# Patient Record
Sex: Male | Born: 1970 | Race: Black or African American | Hispanic: No | Marital: Married | Smoking: Former smoker
Health system: Southern US, Community
[De-identification: ages and names within clinical notes are randomized; demographics above are authoritative.]

## PROBLEM LIST (undated history)

## (undated) DIAGNOSIS — F32A Depression, unspecified: Secondary | ICD-10-CM

## (undated) DIAGNOSIS — F329 Major depressive disorder, single episode, unspecified: Secondary | ICD-10-CM

## (undated) DIAGNOSIS — K219 Gastro-esophageal reflux disease without esophagitis: Secondary | ICD-10-CM

## (undated) DIAGNOSIS — E785 Hyperlipidemia, unspecified: Secondary | ICD-10-CM

## (undated) DIAGNOSIS — T7840XA Allergy, unspecified, initial encounter: Secondary | ICD-10-CM

## (undated) HISTORY — DX: Major depressive disorder, single episode, unspecified: F32.9

## (undated) HISTORY — DX: Hyperlipidemia, unspecified: E78.5

## (undated) HISTORY — PX: OTHER SURGICAL HISTORY: SHX169

## (undated) HISTORY — DX: Depression, unspecified: F32.A

## (undated) HISTORY — DX: Gastro-esophageal reflux disease without esophagitis: K21.9

## (undated) HISTORY — DX: Allergy, unspecified, initial encounter: T78.40XA

---

## 2008-11-22 ENCOUNTER — Ambulatory Visit: Payer: Self-pay | Admitting: Family Medicine

## 2008-11-22 DIAGNOSIS — F329 Major depressive disorder, single episode, unspecified: Secondary | ICD-10-CM

## 2008-11-22 DIAGNOSIS — K219 Gastro-esophageal reflux disease without esophagitis: Secondary | ICD-10-CM

## 2008-11-22 DIAGNOSIS — G4733 Obstructive sleep apnea (adult) (pediatric): Secondary | ICD-10-CM

## 2008-11-22 DIAGNOSIS — J310 Chronic rhinitis: Secondary | ICD-10-CM

## 2008-12-06 ENCOUNTER — Ambulatory Visit: Payer: Self-pay | Admitting: Family Medicine

## 2008-12-06 LAB — CONVERTED CEMR LAB
Bilirubin Urine: NEGATIVE
Specific Gravity, Urine: 1.015
Urobilinogen, UA: 0.2
pH: 5.5

## 2008-12-12 LAB — CONVERTED CEMR LAB
Alkaline Phosphatase: 92 units/L (ref 39–117)
BUN: 12 mg/dL (ref 6–23)
Basophils Absolute: 0.1 10*3/uL (ref 0.0–0.1)
Bilirubin, Direct: 0 mg/dL (ref 0.0–0.3)
CO2: 29 meq/L (ref 19–32)
Calcium: 9.2 mg/dL (ref 8.4–10.5)
Cholesterol: 222 mg/dL — ABNORMAL HIGH (ref 0–200)
Creatinine, Ser: 0.9 mg/dL (ref 0.4–1.5)
Direct LDL: 167.2 mg/dL
Eosinophils Absolute: 0.3 10*3/uL (ref 0.0–0.7)
Glucose, Bld: 255 mg/dL — ABNORMAL HIGH (ref 70–99)
Lymphocytes Relative: 25.5 % (ref 12.0–46.0)
MCHC: 33.1 g/dL (ref 30.0–36.0)
Monocytes Absolute: 0.7 10*3/uL (ref 0.1–1.0)
Neutrophils Relative %: 63 % (ref 43.0–77.0)
PSA: 0.49 ng/mL (ref 0.10–4.00)
Platelets: 248 10*3/uL (ref 150.0–400.0)
RDW: 13.5 % (ref 11.5–14.6)
Total CHOL/HDL Ratio: 5
Triglycerides: 170 mg/dL — ABNORMAL HIGH (ref 0.0–149.0)

## 2008-12-13 ENCOUNTER — Ambulatory Visit: Payer: Self-pay | Admitting: Family Medicine

## 2008-12-13 DIAGNOSIS — E1165 Type 2 diabetes mellitus with hyperglycemia: Secondary | ICD-10-CM

## 2008-12-13 DIAGNOSIS — E785 Hyperlipidemia, unspecified: Secondary | ICD-10-CM

## 2008-12-13 LAB — CONVERTED CEMR LAB: Blood Glucose, Fingerstick: 269

## 2009-01-02 ENCOUNTER — Ambulatory Visit: Payer: Self-pay | Admitting: Family Medicine

## 2009-03-15 ENCOUNTER — Ambulatory Visit: Payer: Self-pay | Admitting: Family Medicine

## 2009-03-15 LAB — CONVERTED CEMR LAB: Blood Glucose, Fingerstick: 145

## 2009-03-18 LAB — CONVERTED CEMR LAB
Hgb A1c MFr Bld: 8.5 % — ABNORMAL HIGH (ref 4.6–6.5)
Microalb, Ur: 0.9 mg/dL (ref 0.0–1.9)

## 2009-05-07 ENCOUNTER — Telehealth: Payer: Self-pay | Admitting: Family Medicine

## 2009-12-13 ENCOUNTER — Ambulatory Visit: Payer: Self-pay | Admitting: Family Medicine

## 2009-12-13 LAB — HM DIABETES FOOT EXAM

## 2009-12-13 LAB — HM DIABETES EYE EXAM: HM Diabetic Eye Exam: NORMAL

## 2009-12-18 LAB — CONVERTED CEMR LAB
LDL Cholesterol: 138 mg/dL — ABNORMAL HIGH (ref 0–99)
Total CHOL/HDL Ratio: 6
Triglycerides: 92 mg/dL (ref 0.0–149.0)
VLDL: 18.4 mg/dL (ref 0.0–40.0)

## 2010-02-04 NOTE — Progress Notes (Signed)
Summary: Metformin med refill, question  Phone Note Call from Patient   Caller: Patient Call For: Evelena Peat MD Summary of Call: Pt reports he takes Metformin 2 tabs two times a day.  His pharmacy only giving him 60 pills at a time, their last Rx is 500mg  two times a day.  This was changed to 1000mg  two times a day after lab work and pharmacy was not aware.  Will send a new Rx tp pharmacy Initial call taken by: Sid Falcon LPN,  May 07, 1608 1:14 PM  Follow-up for Phone Call        Pt informed Follow-up by: Sid Falcon LPN,  May 08, 9602 1:16 PM    Prescriptions: METFORMIN HCL 500 MG TABS (METFORMIN HCL) two by mouth two times a day  #120 x 5   Entered by:   Sid Falcon LPN   Authorized by:   Evelena Peat MD   Signed by:   Sid Falcon LPN on 54/09/8117   Method used:   Electronically to        [facility] # [id number]* (retail)       [address]       [place], [place]  [address]       Ph: [phone]       Fax: [phone]   RxID:   [id number]

## 2010-02-04 NOTE — Assessment & Plan Note (Signed)
Summary: fu on dm/njr   Vital Signs:  Patient profile:   40 year old male Weight:      215 pounds Temp:     99.1 degrees F oral BP sitting:   110 / 80  (left arm) Cuff size:   large  Vitals Entered By: Sid Falcon LPN (March 15, 2009 10:26 AM) CC: DM follow-up Is Patient Diabetic? No CBG Result 145   History of Present Illness: Followup diabetes. Recently diagnosed. On metformin 500 mg  2 po b.i.d. Symptoms of urine frequency and thirst have resolved. Exercising about 4 times per week. Fasting blood sugars run 140. No hypoglycemic symptoms.  he has made dietary changes.  Allergies (verified): No Known Drug Allergies  Past History:  Social History: Last updated: 11/22/2008 Occupation:  Case picker Married Current Smoker, one PPD Alcohol use-yes Regular exercise-no  Past Medical History: Depression GERD Testing for HIV Hay fever/Allergies Type 2 diabetes. PMH reviewed for relevance  Review of Systems  The patient denies anorexia, fever, vision loss, chest pain, syncope, dyspnea on exertion, peripheral edema, headaches, and abdominal pain.    Physical Exam  General:  Well-developed,well-nourished,in no acute distress; alert,appropriate and cooperative throughout examination Head:  Normocephalic and atraumatic without obvious abnormalities. No apparent alopecia or balding. Mouth:  Oral mucosa and oropharynx without lesions or exudates.  Teeth in good repair. Neck:  No deformities, masses, or tenderness noted. Lungs:  Normal respiratory effort, chest expands symmetrically. Lungs are clear to auscultation, no crackles or wheezes. Heart:  Normal rate and regular rhythm. S1 and S2 normal without gallop, murmur, click, rub or other extra sounds. Extremities:  No clubbing, cyanosis, edema, or deformity noted with normal full range of motion of all joints.   Psych:  normally interactive, good eye contact, not anxious appearing, and not depressed appearing.      Impression & Recommendations:  Problem # 1:  DIAB W/O MENTION COMP TYPE II/UNS TYPE UNCNTRL (ICD-250.02) Assessment Improved  check hemoglobin A1c today along with urine microalbumin screen His updated medication list for this problem includes:    Metformin Hcl 500 Mg Tabs (Metformin hcl) .Marland Kitchen..Marland Kitchen Two by mouth two times a day  Orders: Venipuncture (16109) TLB-A1C / Hgb A1C (Glycohemoglobin) (83036-A1C) TLB-Microalbumin/Creat Ratio, Urine (82043-MALB)  Complete Medication List: 1)  Fluticasone Propionate 50 Mcg/act Susp (Fluticasone propionate) .... Two sprays per nostril once daily 2)  Metformin Hcl 500 Mg Tabs (Metformin hcl) .... Two by mouth two times a day 3)  Accu-chek Aviva Strp (Glucose blood) .... Use once daily as directed 4)  Accu-chek Soft Touch Lancets Misc (Lancets) .... Use once daily as directed  Other Orders: Capillary Blood Glucose/CBG (60454)  Patient Instructions: 1)  Please schedule a follow-up appointment in 3 months .  2)  It is important that you exercise reguarly at least 20 minutes 5 times a week. If you develop chest pain, have severe difficulty breathing, or feel very tired, stop exercising immediately and seek medical attention.  3)  Check your blood sugars regularly. If your readings are usually above:  or below 70 you should contact our office.  4)  See your eye doctor yearly to check for diabetic eye damage. 5)  Check your feet each night  for sore areas, calluses or signs of infection.

## 2010-02-06 NOTE — Assessment & Plan Note (Signed)
Summary: FUP/CJR pt rsc/njr   Vital Signs:  Patient profile:   40 year old male Weight:      214 pounds O2 Sat:      97 % Temp:     98.5 degrees F Pulse rate:   97 / minute BP sitting:   130 / 86  Vitals Entered By: Pura Spice, RN (December 13, 2009 11:07 AM) CC: med ck refills states work related stress  sinus problems and been out of metformin 4 days FBS at home this am  171 Is Patient Diabetic? Yes Did you bring your meter with you today? No  Vision Screening:      Vision Comments: 12/2010   History of Present Illness: Patient seen for followup type 2 diabetes. Poor compliance with followup and diet. Also not exercising regularly. Has been taking his medication up until 4 days ago when he ran out. Last A1c 8.5%. No symptoms of hyperglycemia. No recent eye exam.  Frequent nasal congestion. He used Flonase last year but had some increased dryness. Occasional postnasal drip. No purulent nasal secretions. No fever or chills.  patient also hyperlipidemia last year but preferred lifestyle changes and reassess. LDL 167. Will likely need statin therapy.  Diabetes Management History:      He has not been enrolled in the "Diabetic Education Program".  He states that he is not following the diet appropriately.  No sensory loss is reported.  Self foot exams are being performed.  He is not checking home blood sugars.  He says that he is not exercising regularly.        Hypoglycemic symptoms are not occurring.  No hyperglycemic symptoms are reported.        No changes have been made to his treatment plan since last visit.    Allergies (verified): No Known Drug Allergies  Past History:  Past Medical History: Last updated: 03/15/2009 Depression GERD Testing for HIV Hay fever/Allergies Type 2 diabetes.  Family History: Last updated: 12/13/2008 noncontributory  Social History: Last updated: 11/22/2008 Occupation:  Case picker Married Current Smoker, one PPD Alcohol  use-yes Regular exercise-no  Risk Factors: Exercise: no (11/22/2008)  Risk Factors: Smoking Status: current (11/22/2008) Packs/Day: 1.0 (11/22/2008)  Review of Systems  The patient denies anorexia, fever, weight loss, weight gain, chest pain, syncope, dyspnea on exertion, peripheral edema, prolonged cough, headaches, hemoptysis, abdominal pain, melena, hematochezia, severe indigestion/heartburn, and vision loss.    Physical Exam  General:  Well-developed,well-nourished,in no acute distress; alert,appropriate and cooperative throughout examination Eyes:  No corneal or conjunctival inflammation noted. EOMI. Perrla. Funduscopic exam benign, without hemorrhages, exudates or papilledema. Vision grossly normal. Ears:  External ear exam shows no significant lesions or deformities.  Otoscopic examination reveals clear canals, tympanic membranes are intact bilaterally without bulging, retraction, inflammation or discharge. Hearing is grossly normal bilaterally. Mouth:  Oral mucosa and oropharynx without lesions or exudates.  Teeth in good repair. Neck:  No deformities, masses, or tenderness noted. Lungs:  Normal respiratory effort, chest expands symmetrically. Lungs are clear to auscultation, no crackles or wheezes. Heart:  Normal rate and regular rhythm. S1 and S2 normal without gallop, murmur, click, rub or other extra sounds. Extremities:  No clubbing, cyanosis, edema, or deformity noted with normal full range of motion of all joints.   Neurologic:  alert & oriented X3 and cranial nerves II-XII intact.    Diabetes Management Exam:    Foot Exam (with socks and/or shoes not present):  Sensory-Pinprick/Light touch:          Left medial foot (L-4): normal          Left dorsal foot (L-5): normal          Left lateral foot (S-1): normal          Right medial foot (L-4): normal          Right dorsal foot (L-5): normal          Right lateral foot (S-1): normal       Sensory-Monofilament:           Left foot: normal          Right foot: normal       Inspection:          Left foot: normal          Right foot: normal       Nails:          Left foot: normal          Right foot: normal    Eye Exam:       Eye Exam done here today          Results: normal   Impression & Recommendations:  Problem # 1:  DYSLIPIDEMIA (ICD-272.4)  Orders: Specimen Handling (16109) Venipuncture (60454) TLB-Lipid Panel (80061-LIPID)  Problem # 2:  DIAB W/O MENTION COMP TYPE II/UNS TYPE UNCNTRL (ICD-250.02)  His updated medication list for this problem includes:    Metformin Hcl 500 Mg Tabs (Metformin hcl) .Marland Kitchen..Marland Kitchen Two by mouth two times a day  Orders: Specimen Handling (09811) Venipuncture (91478) TLB-A1C / Hgb A1C (Glycohemoglobin) (83036-A1C)  Problem # 3:  CHRONIC RHINITIS (ICD-472.0) trial of Nasacort AQ as he had drying with Flonase.  Complete Medication List: 1)  Metformin Hcl 500 Mg Tabs (Metformin hcl) .... Two by mouth two times a day 2)  Accu-chek Aviva Strp (Glucose blood) .... Use once daily as directed 3)  Accu-chek Soft Touch Lancets Misc (Lancets) .... Use once daily as directed 4)  Nasacort Aq 55 Mcg/act Aers (Triamcinolone acetonide) .... 2 sprays per nostril once daily  Patient Instructions: 1)  Please schedule a follow-up appointment in 3 months .  2)  It is important that you exercise reguarly at least 20 minutes 5 times a week. If you develop chest pain, have severe difficulty breathing, or feel very tired, stop exercising immediately and seek medical attention.  3)  You need to lose weight. Consider a lower calorie diet and regular exercise.  4)  Check your blood sugars regularly. If your readings are usually above:  or below 70 you should contact our office.  5)  It is important that your diabetic A1c level is checked every 3 months.  6)  See your eye doctor yearly to check for diabetic eye damage. 7)  Check your feet each night  for sore areas, calluses or signs  of infection.  Prescriptions: METFORMIN HCL 500 MG TABS (METFORMIN HCL) two by mouth two times a day  #120 x 5   Entered and Authorized by:   Evelena Peat MD   Signed by:   Evelena Peat MD on 12/13/2009   Method used:   Electronically to        [facility] # [id number]* (retail)       [address]       [place], [place]  [address]       Ph: [phone]  Fax: [phone]   RxID:   [id number] NASACORT AQ 55 MCG/ACT AERS (TRIAMCINOLONE ACETONIDE) 2 sprays per nostril once daily  #1 x 11   Entered and Authorized by:   Evelena Peat MD   Signed by:   Evelena Peat MD on 12/13/2009   Method used:   Electronically to        [facility] # [id number]* (retail)       [address]       [place], [place]  [address]       Ph: [phone]       Fax: [phone]   RxID:   [id number]    Orders Added: 1)  Specimen Handling [99000] 2)  Venipuncture [87564] 3)  TLB-Lipid Panel [80061-LIPID] 4)  TLB-A1C / Hgb A1C (Glycohemoglobin) [83036-A1C] 5)  Est. Patient Level IV [33295]

## 2010-03-21 ENCOUNTER — Ambulatory Visit: Payer: Self-pay | Admitting: Family Medicine

## 2010-03-26 ENCOUNTER — Encounter: Payer: Self-pay | Admitting: Family Medicine

## 2010-03-27 ENCOUNTER — Ambulatory Visit (INDEPENDENT_AMBULATORY_CARE_PROVIDER_SITE_OTHER): Payer: 59 | Admitting: Family Medicine

## 2010-03-27 ENCOUNTER — Encounter: Payer: Self-pay | Admitting: Family Medicine

## 2010-03-27 DIAGNOSIS — E785 Hyperlipidemia, unspecified: Secondary | ICD-10-CM

## 2010-03-27 LAB — HEPATIC FUNCTION PANEL
ALT: 36 U/L (ref 0–53)
Alkaline Phosphatase: 63 U/L (ref 39–117)
Bilirubin, Direct: 0.1 mg/dL (ref 0.0–0.3)
Total Bilirubin: 0.3 mg/dL (ref 0.3–1.2)
Total Protein: 6.9 g/dL (ref 6.0–8.3)

## 2010-03-27 LAB — LIPID PANEL
Cholesterol: 135 mg/dL (ref 0–200)
VLDL: 16.4 mg/dL (ref 0.0–40.0)

## 2010-03-27 LAB — HEMOGLOBIN A1C: Hgb A1c MFr Bld: 7.2 % — ABNORMAL HIGH (ref 4.6–6.5)

## 2010-03-27 NOTE — Patient Instructions (Signed)
Diabetes Meal Planning Guide The diabetes meal planning guide is a tool to help you plan your meals and snacks. It is important for people with diabetes to manage their blood sugar levels. Choosing the right foods and the right amounts throughout your day will help control your blood sugar. Eating right can even help you improve your blood pressure and reach or maintain a healthy weight. CARBOHYDRATE COUNTING MADE EASY When you eat carbohydrates, they turn to sugar (glucose). This raises your blood sugar level. Counting carbohydrates can help you control this level so you feel better. When you plan your meals by counting carbohydrates, you can have more flexibility in what you eat and balance your medicine with your food intake. Carbohydrate counting simply means adding up the total amount of carbohydrate grams (g) in your meals or snacks. Try to eat about the same amount at each meal. Foods with carbohydrates are listed below. Each portion below is 1 carbohydrate serving or 15 grams of carbohydrates. Ask your dietician how many grams of carbohydrates you should eat at each meal or snack. Grains and Starches 1 slice bread 1/2 English muffin or hotdog/hamburger bun 3/4 cup cold cereal (unsweetened) 1/3 cup cooked pasta or rice 1/2 cup starchy vegetables (corn, potatoes, peas, beans, winter squash) 1 tortilla (6 inches) 1/4 bagel 1 waffle or pancake (size of a CD) 1/2 cup cooked cereal 4 to 6 small crackers *Whole grain is recommended Fruit 1 cup fresh unsweetened berries, melon, papaya, pineapple 1 small fresh fruit 1/2 banana or mango 1/2 cup fruit juice (4 ounces unsweetened) 1/2 cup canned fruit in natural juice or water 2 tablespoons dried fruit 12 to 15 grapes or cherries Milk and Yogurt 1 cup fat-free or 1% milk 1 cup soy milk 6 ounces light yogurt with sugar-free sweetener 6 ounces low-fat soy yogurt 6 ounces plain yogurt Vegetables 1 cup raw or 1/2 cup cooked is counted as 0  carbohydrates or a "free" food. If you eat 3 or more servings at one meal, count them as 1 carbohydrate serving. Other Carbohydrates 3/4 ounces chips or pretzels 1/2 cup ice cream or frozen yogurt 1/4 cup sherbet or sorbet 2 inch square cake, no frosting 1 tablespoon honey, sugar, jam, jelly, or syrup 2 small cookies 3 squares of graham crackers 3 cups popcorn 6 crackers 1 cup broth-based soup Count 1 cup casserole or other mixed foods as 2 carbohydrate servings. Foods with less than 20 calories in a serving may be counted as 0 carbohydrates or a "free" food. You may want to purchase a book or computer software that lists the carbohydrate gram counts of different foods. In addition, the nutrition facts panel on the labels of the foods you eat are a good source of this information. The label will tell you how big the serving size is and the total number of carbohydrate grams you will be eating per serving. Divide this number by 15 to obtain the number of carbohydrate servings in a portion. Remember: 1 carbohydrate serving equals 15 grams of carbohydrate. SERVING SIZES Measuring foods and serving sizes helps you make sure you are getting the right amount of food. The list below tells how big or small some common serving sizes are.  1 ounce (oz) of cheese.................................4 stacked dice.   2 to 3 oz cooked meat..................................Deck of cards.   1 teaspoon (tsp)............................................Tip of little finger.   1 tablespoon (tbs).........................................Thumb.   2 tbs.............................................................Golf ball.    cup...........................................................Half of a fist.   1 cup............................................................A fist.    SAMPLE DIABETES MEAL PLAN Below is a sample meal plan that includes foods from the grain and starches, dairy, vegetable, fruit, and  meat groups. A dietician can individualize a meal plan to fit your calorie needs and tell you the number of servings needed from each food group. However, controlling the total amount of carbohydrates in your meal or snack is more important than making sure you include all of the food groups at every meal. You may interchange carbohydrate containing foods (dairy, starches, and fruits). The meal plan below is an example of a 2000 calorie diet using carbohydrate counting. This meal plan has 17 carbohydrate servings (carb choices). Breakfast 1 cup oatmeal (2 carb choices) 3/4 cup light yogurt (1 carb choice) 1 cup blueberries (1 carb choice) 1/4 cup almonds  Snack 1 large apple (2 carb choices) 1 low-fat string cheese stick  Lunch Chicken breast salad:  1 cup spinach   1/4 cup chopped tomatoes   2 oz chicken breast, sliced   2 tbs low-fat Italian dressing  12 whole-wheat crackers (2 carb choices) 12 to 15 grapes (1 carb choice) 1 cup low-fat milk (1 carb choice)  Snack 1 cup carrots 1/2 cup hummus (1 carb choice)  Dinner 3 oz broiled salmon 1 cup brown rice (3 carb choices)  Snack 1 1/2 cups steamed broccoli (1 carb choice) drizzled with 1 tsp olive oil and lemon juice 1 cup light pudding (2 carb choices)  DIABETES MEAL PLANNING WORKSHEET Your dietician can use this worksheet to help you decide how many servings of foods and what types of foods are right for you.  Breakfast Food Group and Servings Carb Choices Grain/Starches _______________________________________ Dairy ______________________________________________ Vegetable _______________________________________ Fruit _______________________________________________ Meat _______________________________________________ Fat _____________________________________________ Lunch Food Group and Servings Carb Choices Grain/Starches ________________________________________ Dairy _______________________________________________ Fruit  ________________________________________________ Meat ________________________________________________ Fat _____________________________________________ Dinner Food Group and Servings Carb Choices Grain/Starches ________________________________________ Dairy _______________________________________________ Fruit ________________________________________________ Meat ________________________________________________ Fat _____________________________________________ Snacks Food Group and Servings Carb Choices Grain/Starches ________________________________________ Dairy _______________________________________________ Vegetable ________________________________________ Fruit ________________________________________________ Meat ________________________________________________ Fat _____________________________________________ Daily Totals Starches _________________________ Vegetable __________________________ Fruit ______________________________ Dairy ______________________________ Meat ______________________________ Fat ________________________________  Document Released: 09/18/2004 Document Re-Released: 06/11/2009 [employer] Patient Information 2011 [employer]. 

## 2010-03-27 NOTE — Progress Notes (Signed)
Quick Note:  Pt informed ______ 

## 2010-03-27 NOTE — Progress Notes (Signed)
  Subjective:    Patient ID: Todd English, male    DOB: 1970-01-20, 40 y.o.   MRN: [id number]  HPI  patient seen for followup type 2 diabetes and hyperlipidemia. Blood sugars running around 130 fasting. No symptoms of hyperglycemia. Occasional symptoms of possible hypoglycemia. These were mild and improved promptly with food intake. Last A1c 8.6%. Exercise somewhat inconsistent. He is making efforts at diet change   Hyperlipidemia and initiated simvastatin last visit. LDL 138. Compliant with therapy. No side effects. Denies any recent chest pains or shortness of breath.   Review of Systems  Constitutional: Negative for fever, chills, appetite change, fatigue and unexpected weight change.  Respiratory: Negative for cough and shortness of breath.   Cardiovascular: Negative for chest pain and palpitations.  Gastrointestinal: Negative for abdominal pain.  Neurological: Negative for dizziness, syncope and weakness.       Objective:   Physical Exam  Constitutional: He appears well-developed and well-nourished.  HENT:  Head: Normocephalic and atraumatic.  Eyes: Conjunctivae are normal. Pupils are equal, round, and reactive to light. Left eye exhibits discharge.  Neck: Neck supple. No thyromegaly present.  Cardiovascular: Normal rate and regular rhythm.   No murmur heard. Pulmonary/Chest: Effort normal and breath sounds normal. No respiratory distress. He has no wheezes. He has no rales.  Abdominal: Soft. Bowel sounds are normal.  Musculoskeletal: He exhibits no tenderness.  Lymphadenopathy:    He has no cervical adenopathy.  Skin: No rash noted.  Psychiatric: He has a normal mood and affect.          Assessment & Plan:  #1 type 2 diabetes. Reassess A1c. Discussed exercise and dietary factors. Routine followup 4 months  #2 dyslipidemia. Recheck lipids and hepatic panel. Goal LDL less than 100

## 2010-03-28 ENCOUNTER — Ambulatory Visit: Payer: Self-pay | Admitting: Family Medicine

## 2010-04-11 ENCOUNTER — Other Ambulatory Visit: Payer: Self-pay | Admitting: Family Medicine

## 2010-04-12 ENCOUNTER — Other Ambulatory Visit: Payer: Self-pay | Admitting: Family Medicine

## 2010-07-31 ENCOUNTER — Encounter: Payer: Self-pay | Admitting: Family Medicine

## 2010-07-31 ENCOUNTER — Telehealth: Payer: Self-pay | Admitting: Family Medicine

## 2010-07-31 ENCOUNTER — Ambulatory Visit (INDEPENDENT_AMBULATORY_CARE_PROVIDER_SITE_OTHER): Payer: 59 | Admitting: Family Medicine

## 2010-07-31 DIAGNOSIS — E119 Type 2 diabetes mellitus without complications: Secondary | ICD-10-CM

## 2010-07-31 LAB — HEMOGLOBIN A1C: Hgb A1c MFr Bld: 9.1 % — ABNORMAL HIGH (ref 4.6–6.5)

## 2010-07-31 LAB — MICROALBUMIN / CREATININE URINE RATIO
Creatinine,U: 134.2 mg/dL
Microalb Creat Ratio: 0.9 mg/g (ref 0.0–30.0)

## 2010-07-31 MED ORDER — ACCU-CHEK SOFT TOUCH LANCETS MISC: Status: DC

## 2010-07-31 MED ORDER — GLUCOSE BLOOD VI STRP: ORAL_STRIP | Status: DC

## 2010-07-31 NOTE — Progress Notes (Signed)
  Subjective:    Patient ID: Todd English, male    DOB: 10/02/1970, 40 y.o.   MRN: [id number]  HPI Patient here for diabetes followup. Hyperlipidemia well controlled. Takes metformin 1000 mg twice daily and Amaryl 2 mg daily. Not monitoring blood sugars. No symptoms of hypo-or hyperglycemia. A1c 4 months ago 7.2%. Inconsistent exercise. 6 pound weight gain. No visual changes. Compliant with diabetes medication   Review of Systems  Constitutional: Negative for fever, chills and appetite change.  Eyes: Negative for visual disturbance.  Respiratory: Negative for cough and shortness of breath.   Cardiovascular: Negative for chest pain, palpitations and leg swelling.  Gastrointestinal: Negative for abdominal pain.  Skin: Negative for rash.       Objective:   Physical Exam  Constitutional: He is oriented to person, place, and time. He appears well-developed and well-nourished. No distress.  HENT:  Mouth/Throat: Oropharynx is clear and moist.  Neck: Neck supple.  Cardiovascular: Normal rate and regular rhythm.   Pulmonary/Chest: Effort normal and breath sounds normal. No respiratory distress. He has no wheezes. He has no rales.  Musculoskeletal: He exhibits no edema.  Neurological: He is alert and oriented to person, place, and time.  Psychiatric: He has a normal mood and affect. His behavior is normal.          Assessment & Plan:  Type 2 diabetes. History of marginal control. Reassess A1c and urine microalbumin. Stressed importance of weight loss and exercise. Routine followup 4 months

## 2010-07-31 NOTE — Telephone Encounter (Signed)
Need directions for Accu Chek Aviva Plus test strips. Pharmacist wants to know if ok to put 1 test up to 4 times a day for qty of 100. Pls advise.

## 2010-07-31 NOTE — Telephone Encounter (Signed)
Pharmacist informed OK

## 2010-08-01 NOTE — Progress Notes (Signed)
Quick Note:  Pt informed ______ 

## 2010-08-03 ENCOUNTER — Other Ambulatory Visit: Payer: Self-pay | Admitting: Family Medicine

## 2010-08-20 ENCOUNTER — Other Ambulatory Visit: Payer: Self-pay | Admitting: Family Medicine

## 2010-08-21 ENCOUNTER — Telehealth: Payer: Self-pay | Admitting: Family Medicine

## 2010-08-21 MED ORDER — GLIMEPIRIDE 2 MG PO TABS: 2.0000 mg | ORAL_TABLET | Freq: Every day | ORAL | Status: DC

## 2010-08-21 NOTE — Telephone Encounter (Signed)
Pt requesting refill on glimepiride (AMARYL) 2 MG tablet   [facility]

## 2010-08-21 NOTE — Telephone Encounter (Signed)
Phone note re: swelling entered into wrong "Hayzen" chart.   Error

## 2010-08-21 NOTE — Telephone Encounter (Signed)
Pt VM was seen yesterday, got the message about the need to begin Potassium.  Pt VM reporting her swelling has inceased today. Work phone [phone] Cell [phone]

## 2010-11-13 ENCOUNTER — Ambulatory Visit (INDEPENDENT_AMBULATORY_CARE_PROVIDER_SITE_OTHER): Payer: 59 | Admitting: Family Medicine

## 2010-11-13 ENCOUNTER — Encounter: Payer: Self-pay | Admitting: Family Medicine

## 2010-11-13 VITALS — BP 100/82 | Temp 98.6°F | Wt 224.0 lb

## 2010-11-13 NOTE — Progress Notes (Signed)
  Subjective:    Patient ID: Todd English, male    DOB: Oct 04, 1970, 40 y.o.   MRN: [id number]  HPI  Followup type 2 diabetes. Remains on metformin. Recent A1c 9.1% several months ago. We recommended titration Amaryl to 4 mg but not clear if he actually did that. He is not exercising consistently. Does relate better compliance with diet. Recent fasting blood sugars below 100 range. No symptoms of hyperglycemia. Also remains on simvastatin for hyperlipidemia. No complaints at this time.   Review of Systems  Constitutional: Negative for fever, chills, appetite change and unexpected weight change.  Respiratory: Negative for shortness of breath.   Cardiovascular: Negative for chest pain.  Genitourinary: Negative for frequency.       Objective:   Physical Exam  Constitutional: He appears well-developed and well-nourished.  Neck: Neck supple. No thyromegaly present.  Cardiovascular: Normal rate and regular rhythm.   Pulmonary/Chest: Effort normal and breath sounds normal. No respiratory distress. He has no wheezes. He has no rales.  Musculoskeletal: He exhibits no edema.  Lymphadenopathy:    He has no cervical adenopathy.          Assessment & Plan:  Type 2 diabetes. History of poor control. Reassess A1c. Establish more regular exercise. Routine followup 4 months and plan lipids and hepatic panel then

## 2010-11-14 ENCOUNTER — Encounter: Payer: Self-pay | Admitting: Family Medicine

## 2010-11-14 NOTE — Progress Notes (Signed)
Quick Note:  Pt informed, will increase amaryl to 2 tabs daily ______

## 2010-11-20 ENCOUNTER — Ambulatory Visit: Payer: 59 | Admitting: Family Medicine

## 2011-03-12 ENCOUNTER — Ambulatory Visit (INDEPENDENT_AMBULATORY_CARE_PROVIDER_SITE_OTHER): Payer: 59 | Admitting: Family Medicine

## 2011-03-12 ENCOUNTER — Encounter: Payer: Self-pay | Admitting: Family Medicine

## 2011-03-12 VITALS — BP 104/64 | Temp 98.1°F | Wt 222.0 lb

## 2011-03-12 DIAGNOSIS — E785 Hyperlipidemia, unspecified: Secondary | ICD-10-CM

## 2011-03-12 LAB — LIPID PANEL
LDL Cholesterol: 109 mg/dL — ABNORMAL HIGH (ref 0–99)
Total CHOL/HDL Ratio: 3
Triglycerides: 54 mg/dL (ref 0.0–149.0)

## 2011-03-12 LAB — HEPATIC FUNCTION PANEL
AST: 17 U/L (ref 0–37)
Albumin: 4.2 g/dL (ref 3.5–5.2)
Alkaline Phosphatase: 60 U/L (ref 39–117)
Total Bilirubin: 0.1 mg/dL — ABNORMAL LOW (ref 0.3–1.2)

## 2011-03-12 MED ORDER — GLIMEPIRIDE 4 MG PO TABS: 4.0000 mg | ORAL_TABLET | Freq: Every day | ORAL | Status: DC

## 2011-03-12 NOTE — Progress Notes (Signed)
  Subjective:    Patient ID: Todd English, male    DOB: 30-Jul-1970, 41 y.o.   MRN: [id number]  HPI  Medical followup. Type 2 diabetes. Most recent A1c 7.9%. Rarely checks blood sugars but usually around 120-150 fasting. No symptoms of hyperglycemia. Last visit titrated Amaryl to 4 mg. Remains on metformin 1000 mg twice daily. Takes simvastatin somewhat inconsistently. No side effects. Needs repeat lipids. No history of peripheral vascular disease or CAD. Nonsmoker. Inconsistent exercise. No recent visual symptoms. No neuropathy symptoms.  Past Medical History  Diagnosis Date  . Depression   . GERD (gastroesophageal reflux disease)   . Allergy   . Diabetes mellitus    No past surgical history on file.  reports that he quit smoking about 14 months ago. His smoking use included Cigarettes. He has a 15 pack-year smoking history. He does not have any smokeless tobacco history on file. He reports that he drinks alcohol. His drug history not on file. family history is not on file. No Known Allergies    Review of Systems  Constitutional: Negative for fatigue.  Eyes: Negative for visual disturbance.  Respiratory: Negative for cough, chest tightness and shortness of breath.   Cardiovascular: Negative for chest pain, palpitations and leg swelling.  Neurological: Negative for dizziness, syncope, weakness, light-headedness and headaches.       Objective:   Physical Exam  Constitutional: He appears well-developed and well-nourished.  HENT:  Mouth/Throat: Oropharynx is clear and moist.  Neck: Neck supple. No thyromegaly present.  Cardiovascular: Normal rate and regular rhythm.   Pulmonary/Chest: Effort normal and breath sounds normal. No respiratory distress. He has no wheezes. He has no rales.  Musculoskeletal: He exhibits no edema.          Assessment & Plan:  #1 type 2 diabetes. History of suboptimal control. Recheck A1c. Emphasized importance of regular consistent exercise.  Reminder for eye exam #2 dyslipidemia. Check lipids and hepatic panel. Increase consistency of simvastatin

## 2011-03-17 NOTE — Progress Notes (Signed)
Quick Note:  Pt informed on cell phone VM ______

## 2011-05-06 ENCOUNTER — Other Ambulatory Visit: Payer: Self-pay | Admitting: Family Medicine

## 2011-06-23 ENCOUNTER — Other Ambulatory Visit: Payer: Self-pay | Admitting: Family Medicine

## 2012-02-06 ENCOUNTER — Other Ambulatory Visit: Payer: Self-pay | Admitting: Family Medicine

## 2012-03-27 ENCOUNTER — Other Ambulatory Visit: Payer: Self-pay | Admitting: Family Medicine

## 2012-07-06 ENCOUNTER — Other Ambulatory Visit: Payer: Self-pay | Admitting: Family Medicine

## 2012-08-01 ENCOUNTER — Other Ambulatory Visit: Payer: Self-pay | Admitting: Family Medicine

## 2012-08-15 ENCOUNTER — Other Ambulatory Visit: Payer: Self-pay | Admitting: Family Medicine

## 2012-11-10 ENCOUNTER — Ambulatory Visit: Payer: 59 | Admitting: Family Medicine

## 2012-11-18 ENCOUNTER — Encounter: Payer: Self-pay | Admitting: Family Medicine

## 2012-11-18 ENCOUNTER — Ambulatory Visit (INDEPENDENT_AMBULATORY_CARE_PROVIDER_SITE_OTHER): Payer: 59 | Admitting: Family Medicine

## 2012-11-18 VITALS — BP 130/78 | HR 108 | Temp 98.0°F | Wt 220.0 lb

## 2012-11-18 DIAGNOSIS — E669 Obesity, unspecified: Secondary | ICD-10-CM | POA: Insufficient documentation

## 2012-11-18 DIAGNOSIS — IMO0001 Reserved for inherently not codable concepts without codable children: Secondary | ICD-10-CM

## 2012-11-18 DIAGNOSIS — E785 Hyperlipidemia, unspecified: Secondary | ICD-10-CM

## 2012-11-18 LAB — BASIC METABOLIC PANEL
BUN: 12 mg/dL (ref 6–23)
CO2: 29 mEq/L (ref 19–32)
Chloride: 99 mEq/L (ref 96–112)
Creatinine, Ser: 1.3 mg/dL (ref 0.4–1.5)

## 2012-11-18 LAB — HEPATIC FUNCTION PANEL
Albumin: 4.1 g/dL (ref 3.5–5.2)
Alkaline Phosphatase: 85 U/L (ref 39–117)
Bilirubin, Direct: 0 mg/dL (ref 0.0–0.3)

## 2012-11-18 LAB — MICROALBUMIN / CREATININE URINE RATIO
Creatinine,U: 82.2 mg/dL
Microalb Creat Ratio: 1.7 mg/g (ref 0.0–30.0)
Microalb, Ur: 1.4 mg/dL (ref 0.0–1.9)

## 2012-11-18 LAB — LIPID PANEL: Total CHOL/HDL Ratio: 5

## 2012-11-18 LAB — HM DIABETES FOOT EXAM

## 2012-11-18 MED ORDER — GLIMEPIRIDE 4 MG PO TABS: 4.0000 mg | ORAL_TABLET | Freq: Every day | ORAL | Status: DC

## 2012-11-18 MED ORDER — METFORMIN HCL 500 MG PO TABS: 500.0000 mg | ORAL_TABLET | Freq: Two times a day (BID) | ORAL | Status: DC

## 2012-11-18 MED ORDER — SIMVASTATIN 20 MG PO TABS: 20.0000 mg | ORAL_TABLET | Freq: Every day | ORAL | Status: DC

## 2012-11-18 NOTE — Progress Notes (Signed)
  Subjective:    Patient ID: Todd English, male    DOB: Jun 16, 1970, 42 y.o.   MRN: [id number]  HPI Patient here for medical followup. History of poor compliance we have not seen him for over one year. He states he has been working about 80 hours per week and has not been exercising. Dietary compliance is fair He takes metformin, glimepiride, and simvastatin. Denies any symptoms of hyperglycemia. No recent eye exams. He denies any symptoms of neuropathy and no recent visual changes.  He's never had any hypertension issues. He has dry feet and does not use any moisturizers regularly. Patient is a nonsmoker.  Past Medical History  Diagnosis Date  . Depression   . GERD (gastroesophageal reflux disease)   . Allergy   . Diabetes mellitus    No past surgical history on file.  reports that he quit smoking about 2 years ago. His smoking use included Cigarettes. He has a 15 pack-year smoking history. He does not have any smokeless tobacco history on file. He reports that he drinks alcohol. His drug history is not on file. family history is not on file. No Known Allergies    Review of Systems  Constitutional: Negative for fatigue and unexpected weight change.  Eyes: Negative for visual disturbance.  Respiratory: Negative for cough, chest tightness and shortness of breath.   Cardiovascular: Negative for chest pain, palpitations and leg swelling.  Endocrine: Negative for polydipsia and polyuria.  Neurological: Negative for dizziness, syncope, weakness, light-headedness and headaches.       Objective:   Physical Exam  Constitutional: He appears well-developed and well-nourished.  Cardiovascular: Normal rate and regular rhythm.   Pulmonary/Chest: Effort normal and breath sounds normal. No respiratory distress. He has no wheezes. He has no rales.  Musculoskeletal: He exhibits no edema.  Skin:  Feet are very dry. He has no calluses. No ulcerations. Normal sensory function           Assessment & Plan:  Type 2 diabetes. History of poor control. History of poor compliance. Recheck A1c. Check urine microalbumin. Establish more consistent followup. Refilled his usual medications and will likely need to add some additional changes depending on labs. Set up eye exam  Hyperlipidemia. Recheck lipid and hepatic panel. Refill simvastatin

## 2012-11-18 NOTE — Progress Notes (Signed)
Pre visit review using our clinic review tool, if applicable. No additional management support is needed unless otherwise documented below in the visit note. 

## 2012-11-21 LAB — LDL CHOLESTEROL, DIRECT: Direct LDL: 137.9 mg/dL

## 2012-11-25 ENCOUNTER — Telehealth: Payer: Self-pay | Admitting: Family Medicine

## 2012-11-25 ENCOUNTER — Other Ambulatory Visit: Payer: Self-pay

## 2012-11-25 MED ORDER — SIMVASTATIN 40 MG PO TABS: 40.0000 mg | ORAL_TABLET | Freq: Every day | ORAL | Status: DC

## 2012-11-25 NOTE — Telephone Encounter (Signed)
Pt informed

## 2012-11-25 NOTE — Telephone Encounter (Addendum)
Pt returning your call. Now is a good time to get back in touch w/ pt!!

## 2013-02-22 ENCOUNTER — Ambulatory Visit: Payer: 59 | Admitting: Family Medicine

## 2013-02-23 ENCOUNTER — Ambulatory Visit (INDEPENDENT_AMBULATORY_CARE_PROVIDER_SITE_OTHER): Payer: 59 | Admitting: Family Medicine

## 2013-02-23 ENCOUNTER — Encounter: Payer: Self-pay | Admitting: Family Medicine

## 2013-02-23 ENCOUNTER — Other Ambulatory Visit: Payer: Self-pay

## 2013-02-23 VITALS — BP 130/80 | HR 100 | Wt 225.0 lb

## 2013-02-23 DIAGNOSIS — E1165 Type 2 diabetes mellitus with hyperglycemia: Principal | ICD-10-CM

## 2013-02-23 DIAGNOSIS — E785 Hyperlipidemia, unspecified: Secondary | ICD-10-CM

## 2013-02-23 DIAGNOSIS — IMO0001 Reserved for inherently not codable concepts without codable children: Secondary | ICD-10-CM

## 2013-02-23 LAB — HEPATIC FUNCTION PANEL
ALT: 18 U/L (ref 0–53)
AST: 17 U/L (ref 0–37)
Albumin: 3.9 g/dL (ref 3.5–5.2)
Alkaline Phosphatase: 65 U/L (ref 39–117)
Bilirubin, Direct: 0 mg/dL (ref 0.0–0.3)
Total Bilirubin: 0.3 mg/dL (ref 0.3–1.2)
Total Protein: 7.3 g/dL (ref 6.0–8.3)

## 2013-02-23 LAB — HEMOGLOBIN A1C: Hgb A1c MFr Bld: 11.2 % — ABNORMAL HIGH (ref 4.6–6.5)

## 2013-02-23 LAB — LDL CHOLESTEROL, DIRECT: Direct LDL: 82.3 mg/dL

## 2013-02-23 LAB — HM DIABETES FOOT EXAM

## 2013-02-23 LAB — LIPID PANEL
Cholesterol: 141 mg/dL (ref 0–200)
HDL: 39.8 mg/dL (ref >39.00)
TRIGLYCERIDES: 201 mg/dL — AB (ref 0.0–149.0)
Total CHOL/HDL Ratio: 4
VLDL: 40.2 mg/dL — ABNORMAL HIGH (ref 0.0–40.0)

## 2013-02-23 LAB — HM DIABETES EYE EXAM

## 2013-02-23 MED ORDER — CANAGLIFLOZIN 300 MG PO TABS: 1.0000 | ORAL_TABLET | Freq: Every day | ORAL | Status: DC

## 2013-02-23 MED ORDER — METFORMIN HCL 1000 MG PO TABS: 1000.0000 mg | ORAL_TABLET | Freq: Two times a day (BID) | ORAL | Status: DC

## 2013-02-23 MED ORDER — SIMVASTATIN 40 MG PO TABS: 40.0000 mg | ORAL_TABLET | Freq: Every day | ORAL | Status: DC

## 2013-02-23 NOTE — Patient Instructions (Signed)
Start baby aspirin 81 mg once daily Increase metformin to 1,000 mg twice daily Start Invokana 300 mg once daily.

## 2013-02-23 NOTE — Progress Notes (Signed)
   Subjective:    Patient ID: Todd English, male    DOB: 1970/11/15, 43 y.o.   MRN: [id number]  HPI Medical followup. Patient is type 2 diabetes, hyperlipidemia, obesity. History of poor compliance. He is currently working 6 days per week about 12 hours per day. No consistent exercise. Not checking blood sugars.  Recent hemoglobin A1c 12.6%. He is taking metformin 500 mg twice daily consistently and Amaryl 4 mg daily. We recommended 2-3 week followup he is just now getting back. He does not have any symptoms of hyperglycemia. Recent LDL 137. We increased his simvastatin to 40 mg daily. Tolerating without side effects. No aspirin use. No chest pains. Poor dietary compliance at times.  Past Medical History  Diagnosis Date  . Depression   . GERD (gastroesophageal reflux disease)   . Allergy   . Diabetes mellitus    No past surgical history on file.  reports that he quit smoking about 3 years ago. His smoking use included Cigarettes. He has a 15 pack-year smoking history. He does not have any smokeless tobacco history on file. He reports that he drinks alcohol. His drug history is not on file. family history is not on file. No Known Allergies    Review of Systems  Constitutional: Negative for fatigue.  Eyes: Negative for visual disturbance.  Respiratory: Negative for cough, chest tightness and shortness of breath.   Cardiovascular: Negative for chest pain, palpitations and leg swelling.  Gastrointestinal: Negative for abdominal pain.  Endocrine: Negative for polydipsia and polyuria.  Neurological: Negative for dizziness, syncope, weakness, light-headedness and headaches.       Objective:   Physical Exam  Constitutional: He is oriented to person, place, and time. He appears well-developed and well-nourished.  HENT:  Right Ear: External ear normal.  Left Ear: External ear normal.  Mouth/Throat: Oropharynx is clear and moist.  Eyes: Pupils are equal, round, and reactive to  light.  Neck: Neck supple. No thyromegaly present.  Cardiovascular: Normal rate and regular rhythm.   Pulmonary/Chest: Effort normal and breath sounds normal. No respiratory distress. He has no wheezes. He has no rales.  Musculoskeletal: He exhibits no edema.  Neurological: He is alert and oriented to person, place, and time.  Skin:  Feet are dry but no skin lesions.          Assessment & Plan:  #1 type 2 diabetes. Poor control. History of poor compliance. Recheck A1c. Titrate metformin 1000 mg twice daily. Continue Amaryl. Add Invokana 300 mg once daily. Samples provided.  Patient is strongly encouraged to set up eye exam #2 dyslipidemia. Recent increase in simvastatin. Recheck lipids today

## 2013-02-23 NOTE — Progress Notes (Signed)
Pre visit review using our clinic review tool, if applicable. No additional management support is needed unless otherwise documented below in the visit note. 

## 2013-02-24 ENCOUNTER — Telehealth: Payer: Self-pay

## 2013-02-24 NOTE — Telephone Encounter (Signed)
Emmi education was mailed to patient

## 2013-05-25 ENCOUNTER — Encounter: Payer: Self-pay | Admitting: Family Medicine

## 2013-05-25 ENCOUNTER — Ambulatory Visit (INDEPENDENT_AMBULATORY_CARE_PROVIDER_SITE_OTHER): Payer: 59 | Admitting: Family Medicine

## 2013-05-25 VITALS — BP 130/70 | HR 87 | Wt 223.0 lb

## 2013-05-25 DIAGNOSIS — E785 Hyperlipidemia, unspecified: Secondary | ICD-10-CM

## 2013-05-25 DIAGNOSIS — IMO0001 Reserved for inherently not codable concepts without codable children: Secondary | ICD-10-CM

## 2013-05-25 DIAGNOSIS — E1165 Type 2 diabetes mellitus with hyperglycemia: Principal | ICD-10-CM

## 2013-05-25 NOTE — Progress Notes (Signed)
Pre visit review using our clinic review tool, if applicable. No additional management support is needed unless otherwise documented below in the visit note. 

## 2013-05-25 NOTE — Patient Instructions (Signed)
Start Farxiga 5 mg once daily

## 2013-05-25 NOTE — Progress Notes (Signed)
   Subjective:    Patient ID: Todd English, male    DOB: August 17, 1970, 43 y.o.   MRN: [id number]  HPI Followup poorly controlled type 2 diabetes. Has been working extremely long hours, sometimes up to 90 hours per week and not exercising any. His weight is stable. We had added Invokana last visit and he states he had night sweats which he attributed to this medication and stopped this after just a few doses. He does remain on metformin and Amaryl. Not monitoring blood sugars. No symptoms of polyuria or polydipsia. A1c is very poorly controlled with last A1c over 10%. he's not had any neuropathy symptoms.  He has hyperlipidemia treated with simvastatin. No recent chest pains. No dizziness. No headaches. His blood pressures have remained stable. Urine microalbumin screen negative last fall  Past Medical History  Diagnosis Date  . Depression   . GERD (gastroesophageal reflux disease)   . Allergy   . Diabetes mellitus    No past surgical history on file.  reports that he quit smoking about 3 years ago. His smoking use included Cigarettes. He has a 15 pack-year smoking history. He does not have any smokeless tobacco history on file. He reports that he drinks alcohol. His drug history is not on file. family history is not on file. No Known Allergies    Review of Systems  Constitutional: Negative for fatigue.  Eyes: Negative for visual disturbance.  Respiratory: Negative for cough, chest tightness and shortness of breath.   Cardiovascular: Negative for chest pain, palpitations and leg swelling.  Neurological: Negative for dizziness, syncope, weakness, light-headedness and headaches.       Objective:   Physical Exam  Constitutional: He appears well-developed and well-nourished.  HENT:  Mouth/Throat: Oropharynx is clear and moist.  Cardiovascular: Normal rate and regular rhythm.   Pulmonary/Chest: Effort normal and breath sounds normal. No respiratory distress. He has no wheezes. He  has no rales.  Musculoskeletal: He exhibits no edema.  Skin:  Feet reveal no skin lesions. Good distal foot pulses. Good capillary refill. No calluses. Normal sensation with monofilament testing           Assessment & Plan:  #1 type 2 diabetes. Poorly controlled. Repeat A1c. Suspect this will still be elevated secondary to poor compliance as above. We did provide samples of  farxiga 5 mg once daily. If he does not tolerate this we discussed other options including insulin or possibly GLP-1 inhibitor, though he is somewhat reluctant regarding injectables #2 hyperlipidemia. Well controlled at last visit

## 2013-05-26 LAB — HEMOGLOBIN A1C: Hgb A1c MFr Bld: 8.8 % — ABNORMAL HIGH (ref 4.6–6.5)

## 2013-05-30 ENCOUNTER — Other Ambulatory Visit: Payer: Self-pay | Admitting: Family Medicine

## 2013-05-30 DIAGNOSIS — IMO0001 Reserved for inherently not codable concepts without codable children: Secondary | ICD-10-CM

## 2013-05-30 DIAGNOSIS — E1165 Type 2 diabetes mellitus with hyperglycemia: Principal | ICD-10-CM

## 2013-08-25 ENCOUNTER — Ambulatory Visit: Payer: 59 | Admitting: Family Medicine

## 2013-09-01 ENCOUNTER — Ambulatory Visit: Payer: 59 | Admitting: Family Medicine

## 2013-09-28 ENCOUNTER — Ambulatory Visit (INDEPENDENT_AMBULATORY_CARE_PROVIDER_SITE_OTHER): Payer: 59 | Admitting: Family Medicine

## 2013-09-28 ENCOUNTER — Encounter: Payer: Self-pay | Admitting: Family Medicine

## 2013-09-28 VITALS — BP 130/80 | HR 90 | Wt 227.0 lb

## 2013-09-28 DIAGNOSIS — IMO0001 Reserved for inherently not codable concepts without codable children: Secondary | ICD-10-CM

## 2013-09-28 DIAGNOSIS — E785 Hyperlipidemia, unspecified: Secondary | ICD-10-CM

## 2013-09-28 DIAGNOSIS — E1165 Type 2 diabetes mellitus with hyperglycemia: Principal | ICD-10-CM

## 2013-09-28 LAB — MICROALBUMIN / CREATININE URINE RATIO
CREATININE, U: 218.7 mg/dL
MICROALB UR: 4.2 mg/dL — AB (ref 0.0–1.9)
Microalb Creat Ratio: 1.9 mg/g (ref 0.0–30.0)

## 2013-09-28 LAB — HM DIABETES FOOT EXAM

## 2013-09-28 LAB — HM DIABETES EYE EXAM

## 2013-09-28 LAB — HEMOGLOBIN A1C: Hgb A1c MFr Bld: 9.4 % — ABNORMAL HIGH (ref 4.6–6.5)

## 2013-09-28 NOTE — Progress Notes (Signed)
   Subjective:    Patient ID: Todd English, male    DOB: Feb 07, 1970, 43 y.o.   MRN: [id number]  HPI Patient seen for medical followup. He has poorly controlled type 2 diabetes. We had provided samples of Farxiga last visit but he apparently became concerned about possible side effects and never started this. He remains on metformin and Amaryl. His last A1c was 8.8%. Not monitoring blood sugars. He has not had any symptoms of polyuria or polydipsia. He has not had eye exam during the past year. He remains on simvastatin for hyperlipidemia. Compliant with taking anti-lipid medication.  He states he is getting ready to be laid off from his current job probably in October. He is concerned about cost issues with medication because of that. He currently has very limited exercise as most of his work days are 12 hours. No recent chest pains. No dizziness.  Past Medical History  Diagnosis Date  . Depression   . GERD (gastroesophageal reflux disease)   . Allergy   . Diabetes mellitus    No past surgical history on file.  reports that he quit smoking about 3 years ago. His smoking use included Cigarettes. He has a 15 pack-year smoking history. He does not have any smokeless tobacco history on file. He reports that he drinks alcohol. His drug history is not on file. family history is not on file. No Known Allergies    Review of Systems  Constitutional: Negative for fatigue.  Eyes: Negative for visual disturbance.  Respiratory: Negative for cough, chest tightness and shortness of breath.   Cardiovascular: Negative for chest pain, palpitations and leg swelling.  Endocrine: Negative for polydipsia and polyuria.  Neurological: Negative for dizziness, syncope, weakness, light-headedness and headaches.       Objective:   Physical Exam  Constitutional: He is oriented to person, place, and time. He appears well-developed and well-nourished.  HENT:  Right Ear: External ear normal.  Left Ear:  External ear normal.  Mouth/Throat: Oropharynx is clear and moist.  Eyes: Pupils are equal, round, and reactive to light.  Neck: Neck supple. No thyromegaly present.  Cardiovascular: Normal rate and regular rhythm.   Pulmonary/Chest: Effort normal and breath sounds normal. No respiratory distress. He has no wheezes. He has no rales.  Musculoskeletal: He exhibits no edema.  Neurological: He is alert and oriented to person, place, and time.          Assessment & Plan:  #1 type 2 diabetes. History of poor control. History of poor compliance. Patient is concerned regarding cost issues with impending being laid off. Recheck A1c today. We discussed the importance of establishing more consistent exercise and dietary compliance as a first step. We may have to look at other options such as DPP4 inhibitor although cost may be an issue. #2 dyslipidemia. Continue simvastatin 40 mg daily #3 health maintenance. Flu vaccine recommended and patient declines

## 2013-09-28 NOTE — Progress Notes (Signed)
Pre visit review using our clinic review tool, if applicable. No additional management support is needed unless otherwise documented below in the visit note. 

## 2013-12-03 ENCOUNTER — Other Ambulatory Visit: Payer: Self-pay | Admitting: Family Medicine

## 2014-03-13 ENCOUNTER — Other Ambulatory Visit: Payer: Self-pay | Admitting: Family Medicine

## 2014-05-07 ENCOUNTER — Other Ambulatory Visit: Payer: Self-pay | Admitting: Family Medicine

## 2014-06-12 LAB — HM DIABETES EYE EXAM

## 2014-06-13 ENCOUNTER — Ambulatory Visit (INDEPENDENT_AMBULATORY_CARE_PROVIDER_SITE_OTHER): Payer: Managed Care, Other (non HMO) | Admitting: Family Medicine

## 2014-06-13 ENCOUNTER — Encounter: Payer: Self-pay | Admitting: Family Medicine

## 2014-06-13 VITALS — BP 126/80 | HR 91 | Temp 98.3°F | Wt 222.0 lb

## 2014-06-13 DIAGNOSIS — E538 Deficiency of other specified B group vitamins: Secondary | ICD-10-CM

## 2014-06-13 DIAGNOSIS — IMO0002 Reserved for concepts with insufficient information to code with codable children: Secondary | ICD-10-CM

## 2014-06-13 DIAGNOSIS — R202 Paresthesia of skin: Secondary | ICD-10-CM

## 2014-06-13 DIAGNOSIS — E1165 Type 2 diabetes mellitus with hyperglycemia: Secondary | ICD-10-CM

## 2014-06-13 MED ORDER — GLIMEPIRIDE 4 MG PO TABS: ORAL_TABLET | ORAL | Status: DC

## 2014-06-13 MED ORDER — GLUCOSE BLOOD VI STRP: ORAL_STRIP | Status: DC

## 2014-06-13 MED ORDER — SIMVASTATIN 40 MG PO TABS: 40.0000 mg | ORAL_TABLET | Freq: Every day | ORAL | Status: DC

## 2014-06-13 MED ORDER — METFORMIN HCL 1000 MG PO TABS: 1000.0000 mg | ORAL_TABLET | Freq: Two times a day (BID) | ORAL | Status: DC

## 2014-06-13 MED ORDER — ACCU-CHEK SOFT TOUCH LANCETS MISC: Status: DC

## 2014-06-13 NOTE — Progress Notes (Signed)
Pre visit review using our clinic review tool, if applicable. No additional management support is needed unless otherwise documented below in the visit note. 

## 2014-06-13 NOTE — Progress Notes (Signed)
   Subjective:    Patient ID: Todd English, male    DOB: 04-03-1970, 44 y.o.   MRN: [id number]  HPI Patient seen with tingling sensation and mild numbness involving his fingertips of basically all digits of both hands- right hand greater than left. He is working about 70 hours per week and does a lot of repetitive things with his hands. Denies any wrist pains. No hand pain. No weakness. No cervical neck pains. Denies any feet symptoms. No clear exacerbating factors. No alleviating factors. Symptoms tend to last throughout much of the day.  Does have type 2 diabetes with history of poor control and poor compliance. Last A1c 9.4%. He does have better insurance coverage now. Remains on Amaryl and metformin.  Past Medical History  Diagnosis Date  . Depression   . GERD (gastroesophageal reflux disease)   . Allergy   . Diabetes mellitus    No past surgical history on file.  reports that he quit smoking about 4 years ago. His smoking use included Cigarettes. He has a 15 pack-year smoking history. He does not have any smokeless tobacco history on file. He reports that he drinks alcohol. His drug history is not on file. family history is not on file. No Known Allergies    Review of Systems  Constitutional: Negative for fatigue.  Eyes: Negative for visual disturbance.  Respiratory: Negative for cough, chest tightness and shortness of breath.   Cardiovascular: Negative for chest pain, palpitations and leg swelling.  Neurological: Positive for numbness. Negative for dizziness, syncope, weakness, light-headedness and headaches.       Objective:   Physical Exam  Constitutional: He is oriented to person, place, and time. He appears well-developed and well-nourished.  HENT:  Right Ear: External ear normal.  Left Ear: External ear normal.  Mouth/Throat: Oropharynx is clear and moist.  Eyes: Pupils are equal, round, and reactive to light.  Neck: Neck supple. No thyromegaly present.    Cardiovascular: Normal rate and regular rhythm.   Pulmonary/Chest: Effort normal and breath sounds normal. No respiratory distress. He has no wheezes. He has no rales.  Musculoskeletal: He exhibits no edema.  Neurological: He is alert and oriented to person, place, and time. He has normal reflexes.  No focal strength deficits. Normal sensory function to touch          Assessment & Plan:  Paresthesias involving both hands. Clinically, does not fit with carpal tunnel syndrome. He does have poorly controlled diabetes which could be a risk factor though he denies any lower extremity symptoms. Check B12, A1c, TSH. Tighten up diabetes control.

## 2014-06-14 LAB — VITAMIN D 25 HYDROXY (VIT D DEFICIENCY, FRACTURES): VITD: 11.24 ng/mL — AB (ref 30.00–100.00)

## 2014-06-14 LAB — TSH: TSH: 1.28 u[IU]/mL (ref 0.35–4.50)

## 2014-06-14 LAB — HEMOGLOBIN A1C: HEMOGLOBIN A1C: 9.6 % — AB (ref 4.6–6.5)

## 2014-06-15 ENCOUNTER — Other Ambulatory Visit: Payer: Self-pay | Admitting: *Deleted

## 2014-06-15 ENCOUNTER — Other Ambulatory Visit: Payer: Managed Care, Other (non HMO)

## 2014-06-15 DIAGNOSIS — R202 Paresthesia of skin: Secondary | ICD-10-CM

## 2014-06-15 MED ORDER — DULAGLUTIDE 0.75 MG/0.5ML ~~LOC~~ SOAJ: 1.0000 "pen " | SUBCUTANEOUS | Status: DC

## 2014-06-15 MED ORDER — VITAMIN D (ERGOCALCIFEROL) 1.25 MG (50000 UNIT) PO CAPS: 50000.0000 [IU] | ORAL_CAPSULE | ORAL | Status: DC

## 2014-06-15 NOTE — Addendum Note (Signed)
Addended by: Elmer Picker on: 06/15/2014 03:56 PM   Modules accepted: Orders

## 2014-06-15 NOTE — Addendum Note (Signed)
Addended by: Agnes Lawrence on: 06/15/2014 12:08 PM   Modules accepted: Orders

## 2014-06-16 LAB — VITAMIN B12: Vitamin B-12: 522 pg/mL (ref 211–911)

## 2014-06-21 ENCOUNTER — Telehealth: Payer: Self-pay | Admitting: Family Medicine

## 2014-06-21 MED ORDER — GLUCOSE BLOOD VI STRP: ORAL_STRIP | Status: DC

## 2014-06-21 MED ORDER — ACCU-CHEK SOFT TOUCH LANCETS MISC: Status: DC

## 2014-06-21 NOTE — Telephone Encounter (Signed)
Rx sent to pharmacy   

## 2014-06-21 NOTE — Telephone Encounter (Signed)
Pt called in req test strips

## 2014-06-27 ENCOUNTER — Other Ambulatory Visit: Payer: Self-pay | Admitting: *Deleted

## 2014-06-27 MED ORDER — GLUCOSE BLOOD VI STRP: ORAL_STRIP | Status: DC

## 2014-06-27 NOTE — Telephone Encounter (Signed)
[facility] informs [place] that Accu-chek is no longer covered under patient insurance.  Left message on machine for patient that One Touch is covered, glucometer up front, and rx sent for test strips.

## 2014-07-18 ENCOUNTER — Encounter: Payer: Self-pay | Admitting: Family Medicine

## 2014-09-05 ENCOUNTER — Other Ambulatory Visit: Payer: Self-pay | Admitting: Family Medicine

## 2014-09-09 ENCOUNTER — Other Ambulatory Visit: Payer: Self-pay | Admitting: Family Medicine

## 2014-09-13 ENCOUNTER — Ambulatory Visit (INDEPENDENT_AMBULATORY_CARE_PROVIDER_SITE_OTHER): Payer: Managed Care, Other (non HMO) | Admitting: Family Medicine

## 2014-09-13 ENCOUNTER — Ambulatory Visit: Payer: Managed Care, Other (non HMO) | Admitting: Family Medicine

## 2014-09-13 ENCOUNTER — Encounter: Payer: Self-pay | Admitting: Family Medicine

## 2014-09-13 VITALS — BP 130/80 | HR 84 | Temp 98.0°F | Wt 221.0 lb

## 2014-09-13 DIAGNOSIS — E559 Vitamin D deficiency, unspecified: Secondary | ICD-10-CM | POA: Diagnosis not present

## 2014-09-13 DIAGNOSIS — E1165 Type 2 diabetes mellitus with hyperglycemia: Secondary | ICD-10-CM

## 2014-09-13 DIAGNOSIS — IMO0002 Reserved for concepts with insufficient information to code with codable children: Secondary | ICD-10-CM

## 2014-09-13 MED ORDER — DULAGLUTIDE 0.75 MG/0.5ML ~~LOC~~ SOAJ: 1.0000 "pen " | SUBCUTANEOUS | Status: DC

## 2014-09-13 NOTE — Progress Notes (Signed)
Pre visit review using our clinic review tool, if applicable. No additional management support is needed unless otherwise documented below in the visit note. 

## 2014-09-13 NOTE — Progress Notes (Signed)
   Subjective:    Patient ID: Todd English, male    DOB: [birth year], 44 y.o.   MRN: [id number]  HPI  Patient seen for three-month follow-up. We had started Trulicity but he unfortunately only took this for 2 weeks which were samples that were provided but he never got prescription filled. He's had very poorly controlled diabetes with last A1c 9.6%. Also very low vitamin D level. He is taking vitamin D replacement. He's had fewer paresthesias involving the hands. B12 levels were normal. Does not monitor blood sugars regularly.  Past Medical History  Diagnosis Date  . Depression   . GERD (gastroesophageal reflux disease)   . Allergy   . Diabetes mellitus    No past surgical history on file.  reports that he quit smoking about 4 years ago. His smoking use included Cigarettes. He has a 15 pack-year smoking history. He does not have any smokeless tobacco history on file. He reports that he drinks alcohol. His drug history is not on file. family history is not on file. No Known Allergies   Review of Systems  Constitutional: Negative for fatigue.  Eyes: Negative for visual disturbance.  Respiratory: Negative for cough, chest tightness and shortness of breath.   Cardiovascular: Negative for chest pain, palpitations and leg swelling.  Endocrine: Negative for polydipsia and polyuria.  Neurological: Negative for dizziness, syncope, weakness, light-headedness and headaches.       Objective:   Physical Exam  Constitutional: He is oriented to person, place, and time. He appears well-developed and well-nourished.  HENT:  Right Ear: External ear normal.  Left Ear: External ear normal.  Mouth/Throat: Oropharynx is clear and moist.  Eyes: Pupils are equal, round, and reactive to light.  Neck: Neck supple. No thyromegaly present.  Cardiovascular: Normal rate and regular rhythm.   Pulmonary/Chest: Effort normal and breath sounds normal. No respiratory distress. He has no wheezes. He has no  rales.  Musculoskeletal: He exhibits no edema.  Neurological: He is alert and oriented to person, place, and time.          Assessment & Plan:  #1 type 2 diabetes. History of poor control. Repeat A1c. We provided further samples of Trulicity and also coupon which will hopefully help with cost. We'll plan to repeat A1c in 3 months #2 low vitamin D. Repeat vitamin D level. Continue regular supplementation

## 2014-11-04 ENCOUNTER — Other Ambulatory Visit: Payer: Self-pay | Admitting: Family Medicine

## 2014-12-06 ENCOUNTER — Telehealth: Payer: Self-pay | Admitting: Family Medicine

## 2014-12-06 NOTE — Telephone Encounter (Signed)
Mr. Ditullio's wife called saying after Magda Kiel received Trilicity in addition to Metformin and Glimepiride he mentioned feeling much better. The past three weeks he's been complaining of feeling extremely tired. Per Bing Plume, (pt's wife) they can't afford Trilicity even with the coupon. With the coupon and insurance he would still have to pay $380 for two shots. She's wondering if there's anything else he can do in order to receive the medication.   Edetta's ph# [phone] Ousmane's ph# [phone] (can't take calls at work)  Thank you.

## 2014-12-07 NOTE — Telephone Encounter (Signed)
Can we switch from Trilicity to a different, less expensive medication?

## 2014-12-10 NOTE — Telephone Encounter (Signed)
He really needs to stay on this class of meds (GLP-1) if possible.  Does he have any insurance coverage?  If not, we might be able to check with that drug company to see if he might qualify for special assistance.

## 2014-12-10 NOTE — Telephone Encounter (Signed)
Spoke with Dr. Elease Hashimoto and he approves patient to go on Bydureon or Victoza if insurance allows medication to be less expensive for patient. Dr. Elease Hashimoto prefers patient on Bydureon as it only needs to be taken once a week opposed to Victoza being taken daily. Spoke with patient and he is aware to call his insurance to see if either medication is less costly. He verbalized understanding he or his wife will call office back with update once they have an update

## 2014-12-18 ENCOUNTER — Other Ambulatory Visit: Payer: Self-pay | Admitting: Family Medicine

## 2015-03-10 ENCOUNTER — Other Ambulatory Visit: Payer: Self-pay | Admitting: Family Medicine

## 2015-03-28 ENCOUNTER — Other Ambulatory Visit: Payer: Self-pay | Admitting: Family Medicine

## 2015-04-29 LAB — HM DIABETES EYE EXAM

## 2015-05-10 ENCOUNTER — Encounter: Payer: Self-pay | Admitting: Family Medicine

## 2015-06-08 ENCOUNTER — Other Ambulatory Visit: Payer: Self-pay | Admitting: Family Medicine

## 2015-06-11 ENCOUNTER — Other Ambulatory Visit: Payer: Self-pay | Admitting: Family Medicine

## 2015-07-08 ENCOUNTER — Other Ambulatory Visit: Payer: Self-pay | Admitting: Family Medicine

## 2015-09-07 ENCOUNTER — Other Ambulatory Visit: Payer: Self-pay | Admitting: Family Medicine

## 2015-11-08 ENCOUNTER — Other Ambulatory Visit: Payer: Self-pay | Admitting: Family Medicine

## 2015-12-12 ENCOUNTER — Other Ambulatory Visit: Payer: Self-pay | Admitting: Family Medicine

## 2016-01-14 ENCOUNTER — Other Ambulatory Visit: Payer: Self-pay | Admitting: Family Medicine

## 2016-01-20 ENCOUNTER — Telehealth: Payer: Self-pay | Admitting: Family Medicine

## 2016-01-20 NOTE — Telephone Encounter (Signed)
error 

## 2016-01-21 ENCOUNTER — Ambulatory Visit (INDEPENDENT_AMBULATORY_CARE_PROVIDER_SITE_OTHER): Payer: Managed Care, Other (non HMO) | Admitting: Family Medicine

## 2016-01-21 ENCOUNTER — Encounter: Payer: Self-pay | Admitting: Family Medicine

## 2016-01-21 VITALS — BP 150/100 | HR 78 | Temp 97.5°F | Ht 71.0 in | Wt 214.9 lb

## 2016-01-21 DIAGNOSIS — E1165 Type 2 diabetes mellitus with hyperglycemia: Secondary | ICD-10-CM

## 2016-01-21 DIAGNOSIS — R030 Elevated blood-pressure reading, without diagnosis of hypertension: Secondary | ICD-10-CM

## 2016-01-21 DIAGNOSIS — E785 Hyperlipidemia, unspecified: Secondary | ICD-10-CM

## 2016-01-21 NOTE — Progress Notes (Signed)
Pre visit review using our clinic review tool, if applicable. No additional management support is needed unless otherwise documented below in the visit note. 

## 2016-01-21 NOTE — Progress Notes (Signed)
Subjective:     Patient ID: Todd English, male   DOB: April 26, 1970, 46 y.o.   MRN: [id number]  HPI Patient seen for medical follow-up. History of poor compliance. He has history of obesity, obstructive sleep apnea, GERD, type 2 diabetes, dyslipidemia. We had added trulicity last year after he did well with samples but he went to get this filled and cost was $400 per month and he never filled.. At this point he remains on metformin and glimepiride. Not monitoring blood sugars. He takes simvastatin for hyperlipidemia. Is on baby aspirin. No blood pressure medications. No chest pains. Weight is down about 6 pounds from last visit. He's not had any labs in over a year. No recent eye exams. Denies any blurred vision or neuropathy symptoms.  Past Medical History:  Diagnosis Date  . Allergy   . Depression   . Diabetes mellitus   . GERD (gastroesophageal reflux disease)    No past surgical history on file.  reports that he quit smoking about 6 years ago. His smoking use included Cigarettes. He has a 15.00 pack-year smoking history. He does not have any smokeless tobacco history on file. He reports that he drinks alcohol. His drug history is not on file. family history is not on file. No Known Allergies   Review of Systems  Constitutional: Negative for fatigue.  Eyes: Negative for visual disturbance.  Respiratory: Negative for cough, chest tightness and shortness of breath.   Cardiovascular: Negative for chest pain, palpitations and leg swelling.  Neurological: Negative for dizziness, syncope, weakness, light-headedness and headaches.       Objective:   Physical Exam  Constitutional: He is oriented to person, place, and time. He appears well-developed and well-nourished.  HENT:  Right Ear: External ear normal.  Left Ear: External ear normal.  Mouth/Throat: Oropharynx is clear and moist.  Eyes: Pupils are equal, round, and reactive to light.  Neck: Neck supple. No thyromegaly present.   Cardiovascular: Normal rate and regular rhythm.   Pulmonary/Chest: Effort normal and breath sounds normal. No respiratory distress. He has no wheezes. He has no rales.  Musculoskeletal: He exhibits no edema.  Neurological: He is alert and oriented to person, place, and time.  Skin:  Skin are dry but he has no evidence for neuropathy by micro-film testing. No skin lesions. No calluses.       Assessment:     #1 type 2 diabetes. History of poor control and poor compliance  #2 elevated blood pressure. Repeat reading 138/88  #3 dyslipidemia    Plan:     -Check labs with hemoglobin A1c, lipid panel, hepatic panel, basic metabolic panel -Consider Jardiance depending on A1c results if we can get coverage -We recommended more frequent follow-up and schedule follow-up in 2 months to reassess -Also check urine microalbumin. Consider addition of ARB or ACE inhibitor

## 2016-01-24 LAB — HEPATIC FUNCTION PANEL
ALK PHOS: 80 U/L (ref 39–117)
ALT: 14 U/L (ref 0–53)
AST: 12 U/L (ref 0–37)
Albumin: 4.3 g/dL (ref 3.5–5.2)
BILIRUBIN DIRECT: 0.1 mg/dL (ref 0.0–0.3)
TOTAL PROTEIN: 6.9 g/dL (ref 6.0–8.3)
Total Bilirubin: 0.3 mg/dL (ref 0.2–1.2)

## 2016-01-24 LAB — LIPID PANEL
CHOL/HDL RATIO: 3
Cholesterol: 145 mg/dL (ref 0–200)
HDL: 49.2 mg/dL (ref >39.00)
LDL Cholesterol: 82 mg/dL (ref 0–99)
NonHDL: 96.19
TRIGLYCERIDES: 69 mg/dL (ref 0.0–149.0)
VLDL: 13.8 mg/dL (ref 0.0–40.0)

## 2016-01-24 LAB — MICROALBUMIN / CREATININE URINE RATIO
CREATININE, U: 93.3 mg/dL
Microalb Creat Ratio: 9.2 mg/g (ref 0.0–30.0)
Microalb, Ur: 8.6 mg/dL — ABNORMAL HIGH (ref 0.0–1.9)

## 2016-01-24 LAB — HEMOGLOBIN A1C: Hgb A1c MFr Bld: 10.7 % — ABNORMAL HIGH (ref 4.6–6.5)

## 2016-01-27 ENCOUNTER — Other Ambulatory Visit: Payer: Self-pay

## 2016-01-27 MED ORDER — EMPAGLIFLOZIN 10 MG PO TABS: 10.0000 mg | ORAL_TABLET | Freq: Every day | ORAL | 5 refills | Status: DC

## 2016-05-01 ENCOUNTER — Ambulatory Visit: Payer: Managed Care, Other (non HMO) | Admitting: Family Medicine

## 2016-07-04 ENCOUNTER — Other Ambulatory Visit: Payer: Self-pay | Admitting: Family Medicine

## 2016-07-10 ENCOUNTER — Other Ambulatory Visit: Payer: Self-pay | Admitting: Family Medicine

## 2017-01-28 ENCOUNTER — Other Ambulatory Visit: Payer: Self-pay | Admitting: Family Medicine

## 2017-03-01 ENCOUNTER — Other Ambulatory Visit: Payer: Self-pay | Admitting: Family Medicine

## 2017-05-04 ENCOUNTER — Ambulatory Visit (INDEPENDENT_AMBULATORY_CARE_PROVIDER_SITE_OTHER): Payer: 59 | Admitting: Family Medicine

## 2017-05-04 ENCOUNTER — Encounter: Payer: Self-pay | Admitting: Family Medicine

## 2017-05-04 VITALS — BP 120/90 | HR 76 | Temp 98.4°F | Ht 70.5 in | Wt 220.5 lb

## 2017-05-04 DIAGNOSIS — Z0000 Encounter for general adult medical examination without abnormal findings: Secondary | ICD-10-CM

## 2017-05-04 LAB — LIPID PANEL
CHOLESTEROL: 147 mg/dL (ref 0–200)
HDL: 45.1 mg/dL (ref >39.00)
LDL Cholesterol: 79 mg/dL (ref 0–99)
NonHDL: 101.54
TRIGLYCERIDES: 112 mg/dL (ref 0.0–149.0)
Total CHOL/HDL Ratio: 3
VLDL: 22.4 mg/dL (ref 0.0–40.0)

## 2017-05-04 LAB — HEMOGLOBIN A1C: HEMOGLOBIN A1C: 12.2 % — AB (ref 4.6–6.5)

## 2017-05-04 LAB — BASIC METABOLIC PANEL
BUN: 12 mg/dL (ref 6–23)
CO2: 29 meq/L (ref 19–32)
Calcium: 9.5 mg/dL (ref 8.4–10.5)
Chloride: 101 mEq/L (ref 96–112)
Creatinine, Ser: 1.04 mg/dL (ref 0.40–1.50)
GFR: 97.71 mL/min (ref >60.00)
Glucose, Bld: 221 mg/dL — ABNORMAL HIGH (ref 70–99)
POTASSIUM: 4.3 meq/L (ref 3.5–5.1)
Sodium: 139 mEq/L (ref 135–145)

## 2017-05-04 LAB — CBC WITH DIFFERENTIAL/PLATELET
BASOS PCT: 1 % (ref 0.0–3.0)
Basophils Absolute: 0.1 10*3/uL (ref 0.0–0.1)
EOS ABS: 0.2 10*3/uL (ref 0.0–0.7)
Eosinophils Relative: 3.1 % (ref 0.0–5.0)
HEMATOCRIT: 43.6 % (ref 39.0–52.0)
Hemoglobin: 13.9 g/dL (ref 13.0–17.0)
LYMPHS ABS: 2 10*3/uL (ref 0.7–4.0)
LYMPHS PCT: 28.7 % (ref 12.0–46.0)
MCHC: 32 g/dL (ref 30.0–36.0)
MCV: 80.3 fl (ref 78.0–100.0)
MONO ABS: 0.5 10*3/uL (ref 0.1–1.0)
Monocytes Relative: 7.6 % (ref 3.0–12.0)
NEUTROS ABS: 4.1 10*3/uL (ref 1.4–7.7)
NEUTROS PCT: 59.6 % (ref 43.0–77.0)
PLATELETS: 296 10*3/uL (ref 150.0–400.0)
RBC: 5.43 Mil/uL (ref 4.22–5.81)
RDW: 14.7 % (ref 11.5–15.5)
WBC: 7 10*3/uL (ref 4.0–10.5)

## 2017-05-04 LAB — HEPATIC FUNCTION PANEL
ALT: 14 U/L (ref 0–53)
AST: 11 U/L (ref 0–37)
Albumin: 4.4 g/dL (ref 3.5–5.2)
Alkaline Phosphatase: 85 U/L (ref 39–117)
BILIRUBIN TOTAL: 0.3 mg/dL (ref 0.2–1.2)
Bilirubin, Direct: 0.1 mg/dL (ref 0.0–0.3)
Total Protein: 6.9 g/dL (ref 6.0–8.3)

## 2017-05-04 LAB — MICROALBUMIN / CREATININE URINE RATIO
Creatinine,U: 98.7 mg/dL
MICROALB UR: 9.5 mg/dL — AB (ref 0.0–1.9)
Microalb Creat Ratio: 9.6 mg/g (ref 0.0–30.0)

## 2017-05-04 LAB — TSH: TSH: 1.71 u[IU]/mL (ref 0.35–4.50)

## 2017-05-04 LAB — PSA: PSA: 0.47 ng/mL (ref 0.10–4.00)

## 2017-05-04 MED ORDER — METFORMIN HCL 1000 MG PO TABS: 1000.0000 mg | ORAL_TABLET | Freq: Two times a day (BID) | ORAL | 3 refills | Status: DC

## 2017-05-04 MED ORDER — SIMVASTATIN 40 MG PO TABS: ORAL_TABLET | ORAL | 3 refills | Status: DC

## 2017-05-04 MED ORDER — GLIMEPIRIDE 4 MG PO TABS: ORAL_TABLET | ORAL | 3 refills | Status: DC

## 2017-05-04 NOTE — Patient Instructions (Signed)
Diabetes Mellitus and Nutrition When you have diabetes (diabetes mellitus), it is very important to have healthy eating habits because your blood sugar (glucose) levels are greatly affected by what you eat and drink. Eating healthy foods in the appropriate amounts, at about the same times every day, can help you:  Control your blood glucose.  Lower your risk of heart disease.  Improve your blood pressure.  Reach or maintain a healthy weight.  Every person with diabetes is different, and each person has different needs for a meal plan. Your health care provider may recommend that you work with a diet and nutrition specialist (dietitian) to make a meal plan that is best for you. Your meal plan may vary depending on factors such as:  The calories you need.  The medicines you take.  Your weight.  Your blood glucose, blood pressure, and cholesterol levels.  Your activity level.  Other health conditions you have, such as heart or kidney disease.  How do carbohydrates affect me? Carbohydrates affect your blood glucose level more than any other type of food. Eating carbohydrates naturally increases the amount of glucose in your blood. Carbohydrate counting is a method for keeping track of how many carbohydrates you eat. Counting carbohydrates is important to keep your blood glucose at a healthy level, especially if you use insulin or take certain oral diabetes medicines. It is important to know how many carbohydrates you can safely have in each meal. This is different for every person. Your dietitian can help you calculate how many carbohydrates you should have at each meal and for snack. Foods that contain carbohydrates include:  Bread, cereal, rice, pasta, and crackers.  Potatoes and corn.  Peas, beans, and lentils.  Milk and yogurt.  Fruit and juice.  Desserts, such as cakes, cookies, ice cream, and candy.  How does alcohol affect me? Alcohol can cause a sudden decrease in blood  glucose (hypoglycemia), especially if you use insulin or take certain oral diabetes medicines. Hypoglycemia can be a life-threatening condition. Symptoms of hypoglycemia (sleepiness, dizziness, and confusion) are similar to symptoms of having too much alcohol. If your health care provider says that alcohol is safe for you, follow these guidelines:  Limit alcohol intake to no more than 1 drink per day for nonpregnant women and 2 drinks per day for men. One drink equals 12 oz of beer, 5 oz of wine, or 1 oz of hard liquor.  Do not drink on an empty stomach.  Keep yourself hydrated with water, diet soda, or unsweetened iced tea.  Keep in mind that regular soda, juice, and other mixers may contain a lot of sugar and must be counted as carbohydrates.  What are tips for following this plan? Reading food labels  Start by checking the serving size on the label. The amount of calories, carbohydrates, fats, and other nutrients listed on the label are based on one serving of the food. Many foods contain more than one serving per package.  Check the total grams (g) of carbohydrates in one serving. You can calculate the number of servings of carbohydrates in one serving by dividing the total carbohydrates by 15. For example, if a food has 30 g of total carbohydrates, it would be equal to 2 servings of carbohydrates.  Check the number of grams (g) of saturated and trans fats in one serving. Choose foods that have low or no amount of these fats.  Check the number of milligrams (mg) of sodium in one serving. Most people   should limit total sodium intake to less than 2,300 mg per day.  Always check the nutrition information of foods labeled as "low-fat" or "nonfat". These foods may be higher in added sugar or refined carbohydrates and should be avoided.  Talk to your dietitian to identify your daily goals for nutrients listed on the label. Shopping  Avoid buying canned, premade, or processed foods. These  foods tend to be high in fat, sodium, and added sugar.  Shop around the outside edge of the grocery store. This includes fresh fruits and vegetables, bulk grains, fresh meats, and fresh dairy. Cooking  Use low-heat cooking methods, such as baking, instead of high-heat cooking methods like deep frying.  Cook using healthy oils, such as olive, canola, or sunflower oil.  Avoid cooking with butter, cream, or high-fat meats. Meal planning  Eat meals and snacks regularly, preferably at the same times every day. Avoid going long periods of time without eating.  Eat foods high in fiber, such as fresh fruits, vegetables, beans, and whole grains. Talk to your dietitian about how many servings of carbohydrates you can eat at each meal.  Eat 4-6 ounces of lean protein each day, such as lean meat, chicken, fish, eggs, or tofu. 1 ounce is equal to 1 ounce of meat, chicken, or fish, 1 egg, or 1/4 cup of tofu.  Eat some foods each day that contain healthy fats, such as avocado, nuts, seeds, and fish. Lifestyle   Check your blood glucose regularly.  Exercise at least 30 minutes 5 or more days each week, or as told by your health care provider.  Take medicines as told by your health care provider.  Do not use any products that contain nicotine or tobacco, such as cigarettes and e-cigarettes. If you need help quitting, ask your health care provider.  Work with a counselor or diabetes educator to identify strategies to manage stress and any emotional and social challenges. What are some questions to ask my health care provider?  Do I need to meet with a diabetes educator?  Do I need to meet with a dietitian?  What number can I call if I have questions?  When are the best times to check my blood glucose? Where to find more information:  American Diabetes Association: diabetes.org/food-and-fitness/food  Academy of Nutrition and Dietetics:  www.eatright.org/resources/health/diseases-and-conditions/diabetes  National Institute of Diabetes and Digestive and Kidney Diseases (NIH): www.niddk.nih.gov/health-information/diabetes/overview/diet-eating-physical-activity Summary  A healthy meal plan will help you control your blood glucose and maintain a healthy lifestyle.  Working with a diet and nutrition specialist (dietitian) can help you make a meal plan that is best for you.  Keep in mind that carbohydrates and alcohol have immediate effects on your blood glucose levels. It is important to count carbohydrates and to use alcohol carefully. This information is not intended to replace advice given to you by your health care provider. Make sure you discuss any questions you have with your health care provider. Document Released: 09/18/2004 Document Revised: 01/27/2016 Document Reviewed: 01/27/2016 Elsevier Interactive Patient Education  2018 Elsevier Inc.  

## 2017-05-04 NOTE — Progress Notes (Signed)
  Subjective:     Patient ID: Todd English, male   DOB: [birth year], 47 y.o.   MRN: [id number]  HPI Patient seen for physical. He has history of type 2 diabetes with poor control, dyslipidemia, hypertension. He has basically been lost to follow-up. He is taking metformin along with Amaryl and atorvastatin.. We had started Jardiance last visit but apparently his insurance would not cover this and he never started  . He had taken Trulicity the past but also had lapse of insurance coverage. Has new insurance, coverage currently. He has occasional polyuria and polydipsia. Works third shift. No regular exercise. Denies any hypoglycemia. Does not monitor blood sugars at home currently.  Patient does get eye exams and had last eye exam about 6 months ago. No retinopathy. Denies any neuropathy symptoms.  Past Medical History:  Diagnosis Date  . Allergy   . Depression   . Diabetes mellitus   . GERD (gastroesophageal reflux disease)    No past surgical history on file.  reports that he quit smoking about 7 years ago. His smoking use included cigarettes. He has a 15.00 pack-year smoking history. He has never used smokeless tobacco. He reports that he drinks alcohol. His drug history is not on file. family history is not on file. No Known Allergies   Review of Systems  Constitutional: Negative for fatigue.  HENT: Negative for trouble swallowing.   Eyes: Negative for visual disturbance.  Respiratory: Negative for cough, chest tightness and shortness of breath.   Cardiovascular: Negative for chest pain, palpitations and leg swelling.  Gastrointestinal: Negative for abdominal pain.  Endocrine: Positive for polydipsia and polyuria.  Genitourinary: Negative for dysuria.  Neurological: Negative for dizziness, syncope, weakness, light-headedness and headaches.       Objective:   Physical Exam  Constitutional: He is oriented to person, place, and time. He appears well-developed and  well-nourished.  HENT:  Right Ear: External ear normal.  Left Ear: External ear normal.  Mouth/Throat: Oropharynx is clear and moist.  Eyes: Pupils are equal, round, and reactive to light.  Neck: Neck supple. No thyromegaly present.  Cardiovascular: Normal rate and regular rhythm.  Pulmonary/Chest: Effort normal and breath sounds normal. No respiratory distress. He has no wheezes. He has no rales.  Musculoskeletal: He exhibits no edema.  Neurological: He is alert and oriented to person, place, and time.  Skin:  Feet reveal no skin lesions. Good distal foot pulses. Good capillary refill. No calluses. Normal sensation with monofilament testing        Assessment:     Physical exam. Patient has history of dyslipidemia, type 2 diabetes, hypertension with poor compliance    Plan:     -Obtain labs including A1c, urine microalbumin, PSA along with lipids and chemistries -Refilled his current medications for one year -Will likely need additional medication. We have asked that they look ahead to see what his insurance will cover beyond the Amaryl and metformin -Consider GLP-1 med. -Establish more consistent exercise -Continue at least yearly eye exams  [name] MD [facility] at [facility]

## 2017-05-05 ENCOUNTER — Other Ambulatory Visit: Payer: Self-pay | Admitting: Family Medicine

## 2017-05-05 MED ORDER — DULAGLUTIDE 0.75 MG/0.5ML ~~LOC~~ SOAJ: 0.7500 mg | SUBCUTANEOUS | 3 refills | Status: DC

## 2017-08-03 ENCOUNTER — Encounter: Payer: Self-pay | Admitting: Family Medicine

## 2017-08-03 ENCOUNTER — Ambulatory Visit (INDEPENDENT_AMBULATORY_CARE_PROVIDER_SITE_OTHER): Payer: 59 | Admitting: Family Medicine

## 2017-08-03 VITALS — BP 120/80 | HR 90 | Temp 97.6°F | Wt 217.6 lb

## 2017-08-03 DIAGNOSIS — E1165 Type 2 diabetes mellitus with hyperglycemia: Secondary | ICD-10-CM | POA: Diagnosis not present

## 2017-08-03 LAB — POCT GLYCOSYLATED HEMOGLOBIN (HGB A1C): HEMOGLOBIN A1C: 7.8 % — AB (ref 4.0–5.6)

## 2017-08-03 NOTE — Progress Notes (Signed)
  Subjective:     Patient ID: Todd English, male   DOB: 08-02-1970, 47 y.o.   MRN: [id number]  HPI Patient seen for follow-up regarding type 2 diabetes. He was seen for physical back in April and had basically not been taking any of his medications at that point. We started Trulicity and he is tolerating this well with no side effects. His recent A1c was 12.2% was down to 7.8% today. Still not exercising. Overall, fairly good compliance with diet. Still eats some sweets with things like ice cream usually about once or twice per week.  No hypoglycemic symptoms. Patient also remains on simvastatin. Recent lipids were well controlled.  Past Medical History:  Diagnosis Date  . Allergy   . Depression   . Diabetes mellitus   . GERD (gastroesophageal reflux disease)    No past surgical history on file.  reports that he quit smoking about 7 years ago. His smoking use included cigarettes. He has a 15.00 pack-year smoking history. He has never used smokeless tobacco. He reports that he drinks alcohol. His drug history is not on file. family history is not on file. No Known Allergies   Review of Systems  Constitutional: Negative for fatigue.  Eyes: Negative for visual disturbance.  Respiratory: Negative for cough, chest tightness and shortness of breath.   Cardiovascular: Negative for chest pain, palpitations and leg swelling.  Endocrine: Negative for polydipsia and polyuria.  Neurological: Negative for dizziness, syncope, weakness, light-headedness and headaches.       Objective:   Physical Exam  Constitutional: He is oriented to person, place, and time. He appears well-developed and well-nourished.  HENT:  Right Ear: External ear normal.  Left Ear: External ear normal.  Mouth/Throat: Oropharynx is clear and moist.  Eyes: Pupils are equal, round, and reactive to light.  Neck: Neck supple. No thyromegaly present.  Cardiovascular: Normal rate and regular rhythm.  Pulmonary/Chest:  Effort normal and breath sounds normal. No respiratory distress. He has no wheezes. He has no rales.  Musculoskeletal: He exhibits no edema.  Neurological: He is alert and oriented to person, place, and time.       Assessment:     Type 2 diabetes. History of poor compliance. Greatly improved since last visit with reduction from 12.2% to 7.8%    Plan:     -We explained our goal is to get A1c less than 7%. We gave him option of additional medication versus lifestyle modification and increasing exercise and he prefers the latter. We'll plan follow-up in 3 months and if A1c not further goal at that point consider additional medication  [name] MD [facility] at [facility]

## 2017-08-03 NOTE — Patient Instructions (Signed)
Try to get back to more exercise.   Congratulations!  Your A1C is down to 7.8 from 12.2

## 2017-08-20 ENCOUNTER — Other Ambulatory Visit: Payer: Self-pay | Admitting: Family Medicine

## 2017-11-05 ENCOUNTER — Encounter: Payer: Self-pay | Admitting: Family Medicine

## 2017-11-05 ENCOUNTER — Other Ambulatory Visit: Payer: Self-pay

## 2017-11-05 ENCOUNTER — Ambulatory Visit (INDEPENDENT_AMBULATORY_CARE_PROVIDER_SITE_OTHER): Payer: 59 | Admitting: Family Medicine

## 2017-11-05 VITALS — BP 124/72 | HR 106 | Temp 98.5°F | Ht 70.5 in | Wt 213.2 lb

## 2017-11-05 DIAGNOSIS — E1165 Type 2 diabetes mellitus with hyperglycemia: Secondary | ICD-10-CM

## 2017-11-05 LAB — POCT GLYCOSYLATED HEMOGLOBIN (HGB A1C): Hemoglobin A1C: 7 % — AB (ref 4.0–5.6)

## 2017-11-05 NOTE — Progress Notes (Addendum)
  Subjective:     Patient ID: Todd English, male   DOB: March 12, 1970, 47 y.o.   MRN: [id number]  HPI Follow-up type 2 diabetes.  Last A1c 7.8%.  He had poor control in the past.  He has made some diligent changes with diet with reducing sugars and starches.  He remains on metformin, simvastatin, Amaryl, and Trulicity.  No issues with medications.  Compliant with all.  Working very long hours.  He works third shift.  Past Medical History:  Diagnosis Date  . Allergy   . Depression   . Diabetes mellitus   . GERD (gastroesophageal reflux disease)    History reviewed. No pertinent surgical history.  reports that he quit smoking about 7 years ago. His smoking use included cigarettes. He has a 15.00 pack-year smoking history. He has never used smokeless tobacco. He reports that he drinks alcohol. His drug history is not on file. family history is not on file. No Known Allergies   Review of Systems  Constitutional: Negative for fatigue.  Eyes: Negative for visual disturbance.  Respiratory: Negative for cough, chest tightness and shortness of breath.   Cardiovascular: Negative for chest pain, palpitations and leg swelling.  Neurological: Negative for dizziness, syncope, weakness, light-headedness and headaches.       Objective:   Physical Exam  Constitutional: He is oriented to person, place, and time. He appears well-developed and well-nourished.  HENT:  Right Ear: External ear normal.  Left Ear: External ear normal.  Mouth/Throat: Oropharynx is clear and moist.  Eyes: Pupils are equal, round, and reactive to light.  Neck: Neck supple. No thyromegaly present.  Cardiovascular: Normal rate and regular rhythm.  Pulmonary/Chest: Effort normal and breath sounds normal. No respiratory distress. He has no wheezes. He has no rales.  Musculoskeletal: He exhibits no edema.  Neurological: He is alert and oriented to person, place, and time.  Skin:  Feet reveal no skin lesions. Good distal  foot pulses. Good capillary refill. No calluses. Normal sensation with monofilament testing.  Does have very dry skin        Assessment:     Type 2 diabetes.  Improving with A1c today 7.0%    Plan:     -Continue healthy lifestyle changes -Flu vaccine declined -Recommend 34-month follow-up and repeat full labs at that point including lipids  [name] MD [facility] at [facility]

## 2017-11-05 NOTE — Patient Instructions (Signed)
Set up repeat eye exam  Your sugars are improving!  A1C was 7.0%

## 2017-12-15 ENCOUNTER — Other Ambulatory Visit: Payer: Self-pay | Admitting: Family Medicine

## 2018-04-16 ENCOUNTER — Other Ambulatory Visit: Payer: Self-pay | Admitting: Family Medicine

## 2018-05-06 ENCOUNTER — Ambulatory Visit (INDEPENDENT_AMBULATORY_CARE_PROVIDER_SITE_OTHER): Payer: 59 | Admitting: Family Medicine

## 2018-05-06 ENCOUNTER — Other Ambulatory Visit: Payer: Self-pay

## 2018-05-06 DIAGNOSIS — E1165 Type 2 diabetes mellitus with hyperglycemia: Secondary | ICD-10-CM | POA: Diagnosis not present

## 2018-05-06 DIAGNOSIS — E785 Hyperlipidemia, unspecified: Secondary | ICD-10-CM

## 2018-05-06 MED ORDER — DULAGLUTIDE 0.75 MG/0.5ML ~~LOC~~ SOAJ: SUBCUTANEOUS | 3 refills | Status: DC

## 2018-05-06 MED ORDER — SIMVASTATIN 40 MG PO TABS: ORAL_TABLET | ORAL | 3 refills | Status: DC

## 2018-05-06 MED ORDER — METFORMIN HCL 1000 MG PO TABS: 1000.0000 mg | ORAL_TABLET | Freq: Two times a day (BID) | ORAL | 3 refills | Status: DC

## 2018-05-06 MED ORDER — GLIMEPIRIDE 4 MG PO TABS: ORAL_TABLET | ORAL | 3 refills | Status: DC

## 2018-05-06 NOTE — Progress Notes (Signed)
Patient ID: Todd English, male   DOB: 07-24-1970, 48 y.o.   MRN: [id number]  This visit type was conducted due to national recommendations for restrictions regarding the COVID-19 pandemic in an effort to limit this patient's exposure and mitigate transmission in our community.   Virtual Visit via Video Note  I connected with Todd English on 05/06/18 at  8:00 AM EDT by a video enabled telemedicine application and verified that I am speaking with the correct person using two identifiers.  Location patient: home Location provider:work or home office Persons participating in the virtual visit: patient, provider  I discussed the limitations of evaluation and management by telemedicine and the availability of in person appointments. The patient expressed understanding and agreed to proceed.   HPI: Patient has hyperlipidemia and type 2 diabetes.  His medications include Trulicity, metformin, Amaryl, and simvastatin.  Last A1c back in November was 7.0%.  He states his fasting blood sugars have consistently been 120 130 range.  He feels well overall.  His weight is stable.  No hypoglycemia.  No myalgias.  Denies any chest pains.  Compliant with medications.  He feels that his blood sugar control has improved over the past year.  He has been better with dietary choices.   ROS: See pertinent positives and negatives per HPI.  Past Medical History:  Diagnosis Date  . Allergy   . Depression   . Diabetes mellitus   . GERD (gastroesophageal reflux disease)     No past surgical history on file.  No family history on file.  SOCIAL HX: Quit smoking 2011.  He does not use any regular alcohol.  Works third shift.  Gets very little sleep- usually about 3 to 4 hours/day   Current Outpatient Medications:  .  glimepiride (AMARYL) 4 MG tablet, TAKE ONE TABLET BY MOUTH ONE TIME DAILY WITH BREAKFAST, Disp: 90 tablet, Rfl: 3 .  glucose blood test strip, One Touch Verio Flex ,  Test up to 4 times  daily, Dx E11.9, Disp: 100 each, Rfl: 12 .  Lancets (ACCU-CHEK SOFT TOUCH) lancets, May check up 4 times daily E11.9, Disp: 100 each, Rfl: 2 .  metFORMIN (GLUCOPHAGE) 1000 MG tablet, Take 1 tablet (1,000 mg total) by mouth 2 (two) times daily with a meal., Disp: 180 tablet, Rfl: 3 .  OVER THE COUNTER MEDICATION, Vitamin d, Disp: , Rfl:  .  simvastatin (ZOCOR) 40 MG tablet, TAKE 1 TABLET (40 MG TOTAL) BY MOUTH DAILY., Disp: 90 tablet, Rfl: 3 .  TRULICITY 4.09 BD/5.3GD SOPN, INJECT 0.75 MG INTO THE SKIN EVERY 7 (SEVEN) DAYS., Disp: 2 pen, Rfl: 3  EXAM:  VITALS per patient if applicable:  GENERAL: alert, oriented, appears well and in no acute distress  HEENT: atraumatic, conjunttiva clear, no obvious abnormalities on inspection of external nose and ears  NECK: normal movements of the head and neck  LUNGS: on inspection no signs of respiratory distress, breathing rate appears normal, no obvious gross SOB, gasping or wheezing  CV: no obvious cyanosis  MS: moves all visible extremities without noticeable abnormality  PSYCH/NEURO: pleasant and cooperative, no obvious depression or anxiety, speech and thought processing grossly intact  ASSESSMENT AND PLAN:  Discussed the following assessment and plan:  #1 type 2 diabetes-overall improving over the past year -We will set up 38-month follow-up and plan to get labs then including hemoglobin A1c and lipids -Refill all diabetic medications for 1 year  #2 hyperlipidemia -Refill simvastatin and lab work at 37-month follow-up  I discussed the assessment and treatment plan with the patient. The patient was provided an opportunity to ask questions and all were answered. The patient agreed with the plan and demonstrated an understanding of the instructions.   The patient was advised to call back or seek an in-person evaluation if the symptoms worsen or if the condition fails to improve as anticipated.   Carolann Littler, MD

## 2018-05-11 ENCOUNTER — Telehealth: Payer: Self-pay | Admitting: Family Medicine

## 2018-05-11 NOTE — Telephone Encounter (Signed)
LMVM for the patient to call back and schedule a 3 months follow up with Dr. Elease Hashimoto

## 2018-07-27 ENCOUNTER — Other Ambulatory Visit: Payer: Self-pay

## 2018-07-27 ENCOUNTER — Ambulatory Visit (INDEPENDENT_AMBULATORY_CARE_PROVIDER_SITE_OTHER): Payer: 59 | Admitting: Family Medicine

## 2018-07-27 ENCOUNTER — Encounter: Payer: Self-pay | Admitting: Family Medicine

## 2018-07-27 VITALS — BP 122/82 | HR 88 | Temp 97.9°F | Ht 70.25 in | Wt 220.0 lb

## 2018-07-27 DIAGNOSIS — Z23 Encounter for immunization: Secondary | ICD-10-CM

## 2018-07-27 DIAGNOSIS — Z0000 Encounter for general adult medical examination without abnormal findings: Secondary | ICD-10-CM | POA: Diagnosis not present

## 2018-07-27 LAB — HEPATIC FUNCTION PANEL
ALT: 13 U/L (ref 0–53)
AST: 13 U/L (ref 0–37)
Albumin: 4.6 g/dL (ref 3.5–5.2)
Alkaline Phosphatase: 63 U/L (ref 39–117)
Bilirubin, Direct: 0.1 mg/dL (ref 0.0–0.3)
Total Bilirubin: 0.4 mg/dL (ref 0.2–1.2)
Total Protein: 7.2 g/dL (ref 6.0–8.3)

## 2018-07-27 LAB — MICROALBUMIN / CREATININE URINE RATIO
Creatinine,U: 95 mg/dL
Microalb Creat Ratio: 11.4 mg/g (ref 0.0–30.0)
Microalb, Ur: 10.8 mg/dL — ABNORMAL HIGH (ref 0.0–1.9)

## 2018-07-27 LAB — CBC WITH DIFFERENTIAL/PLATELET
Basophils Absolute: 0.1 10*3/uL (ref 0.0–0.1)
Basophils Relative: 0.8 % (ref 0.0–3.0)
Eosinophils Absolute: 0.2 10*3/uL (ref 0.0–0.7)
Eosinophils Relative: 2.4 % (ref 0.0–5.0)
HCT: 44.5 % (ref 39.0–52.0)
Hemoglobin: 14 g/dL (ref 13.0–17.0)
Lymphocytes Relative: 32.2 % (ref 12.0–46.0)
Lymphs Abs: 2.2 10*3/uL (ref 0.7–4.0)
MCHC: 31.4 g/dL (ref 30.0–36.0)
MCV: 81.5 fl (ref 78.0–100.0)
Monocytes Absolute: 0.6 10*3/uL (ref 0.1–1.0)
Monocytes Relative: 8.5 % (ref 3.0–12.0)
Neutro Abs: 3.8 10*3/uL (ref 1.4–7.7)
Neutrophils Relative %: 56.1 % (ref 43.0–77.0)
Platelets: 303 10*3/uL (ref 150.0–400.0)
RBC: 5.45 Mil/uL (ref 4.22–5.81)
RDW: 15.3 % (ref 11.5–15.5)
WBC: 6.8 10*3/uL (ref 4.0–10.5)

## 2018-07-27 LAB — BASIC METABOLIC PANEL
BUN: 17 mg/dL (ref 6–23)
CO2: 29 mEq/L (ref 19–32)
Calcium: 9.9 mg/dL (ref 8.4–10.5)
Chloride: 100 mEq/L (ref 96–112)
Creatinine, Ser: 1.18 mg/dL (ref 0.40–1.50)
GFR: 79.07 mL/min (ref >60.00)
Glucose, Bld: 92 mg/dL (ref 70–99)
Potassium: 4.5 mEq/L (ref 3.5–5.1)
Sodium: 138 mEq/L (ref 135–145)

## 2018-07-27 LAB — LIPID PANEL
Cholesterol: 189 mg/dL (ref 0–200)
HDL: 50.5 mg/dL (ref >39.00)
LDL Cholesterol: 110 mg/dL — ABNORMAL HIGH (ref 0–99)
NonHDL: 138.36
Total CHOL/HDL Ratio: 4
Triglycerides: 144 mg/dL (ref 0.0–149.0)
VLDL: 28.8 mg/dL (ref 0.0–40.0)

## 2018-07-27 LAB — HEMOGLOBIN A1C: Hgb A1c MFr Bld: 7.8 % — ABNORMAL HIGH (ref 4.6–6.5)

## 2018-07-27 LAB — PSA: PSA: 0.49 ng/mL (ref 0.10–4.00)

## 2018-07-27 LAB — TSH: TSH: 3.29 u[IU]/mL (ref 0.35–4.50)

## 2018-07-27 NOTE — Progress Notes (Signed)
Subjective:     Patient ID: Todd English, male   DOB: [birth year], 48 y.o.   MRN: [id number]  HPI Patient is here for physical exam.  His chronic problems include history of type 2 diabetes, mild obstructive sleep apnea, hyperlipidemia.  He is currently on regimen of metformin, Trulicity, Amaryl for diabetes and blood sugars have been controlled.  He takes simvastatin.  No history of hypertension.  Blood sugars been stable.  Not getting any consistent exercise.  Needs tetanus booster.  Also needs repeat eye exam.  He denies any neuropathy symptoms.  No recent chest pains.  Non-smoker.  Has been working overtime hours recently with his job.  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 Allergy   . Depression   . Diabetes mellitus   . GERD (gastroesophageal reflux disease)    History reviewed. No pertinent surgical history.  reports that he quit smoking about 8 years ago. His smoking use included cigarettes. He has a 15.00 pack-year smoking history. He has never used smokeless tobacco. He reports current alcohol use. No history on file for drug. family history is not on file. No Known Allergies   Review of Systems  Constitutional: Negative for activity change, appetite change, fatigue and fever.  HENT: Negative for congestion, ear pain and trouble swallowing.   Eyes: Negative for pain and visual disturbance.  Respiratory: Negative for cough, shortness of breath and wheezing.   Cardiovascular: Negative for chest pain and palpitations.  Gastrointestinal: Negative for abdominal distention, abdominal pain, blood in stool, constipation, diarrhea, nausea, rectal pain and vomiting.  Endocrine: Negative for polydipsia and polyuria.  Genitourinary: Negative for dysuria, hematuria and testicular pain.  Musculoskeletal: Negative for arthralgias and joint swelling.  Skin: Negative for rash.  Neurological: Negative for  dizziness, syncope and headaches.  Hematological: Negative for adenopathy.  Psychiatric/Behavioral: Negative for confusion and dysphoric mood.       Objective:   Physical Exam Constitutional:      General: He is not in acute distress.    Appearance: He is well-developed.  HENT:     Head: Normocephalic and atraumatic.     Right Ear: External ear normal.     Left Ear: External ear normal.  Eyes:     Conjunctiva/sclera: Conjunctivae normal.     Pupils: Pupils are equal, round, and reactive to light.  Neck:     Musculoskeletal: Normal range of motion and neck supple.     Thyroid: No thyromegaly.  Cardiovascular:     Rate and Rhythm: Normal rate and regular rhythm.     Heart sounds: Normal heart sounds. No murmur.  Pulmonary:     Effort: No respiratory distress.     Breath sounds: No wheezing or rales.  Abdominal:     General: Bowel sounds are normal. There is no distension.     Palpations: Abdomen is soft. There is no mass.     Tenderness: There is no abdominal tenderness. There is no guarding or rebound.  Lymphadenopathy:     Cervical: No cervical adenopathy.  Skin:    Findings: No rash.     Comments: Feet reveal no skin lesions. Good distal foot pulses. Good capillary refill. No calluses. Normal sensation with monofilament testing   Neurological:     Mental Status: He is alert and oriented to person, place, and time.     Cranial Nerves: No cranial nerve deficit.  Deep Tendon Reflexes: Reflexes normal.        Assessment:     Physical exam.  Patient has chronic problems as above which have been stable    Plan:     -Check labs today including A1c and urine microalbumin screen -Establish more consistent exercise -Recommended Pneumovax but patient declines -Tetanus booster given -We will plan on routine 64-month follow-up unless indicated sooner by labs above  [name] MD [facility] at [facility]

## 2018-07-27 NOTE — Addendum Note (Signed)
Addended by: Anibal Henderson on: 07/27/2018 09:45 AM   Modules accepted: Orders

## 2019-01-27 ENCOUNTER — Encounter: Payer: Self-pay | Admitting: Family Medicine

## 2019-01-27 ENCOUNTER — Ambulatory Visit (INDEPENDENT_AMBULATORY_CARE_PROVIDER_SITE_OTHER): Payer: 59 | Admitting: Family Medicine

## 2019-01-27 ENCOUNTER — Other Ambulatory Visit: Payer: Self-pay

## 2019-01-27 VITALS — BP 140/90 | HR 89 | Temp 97.7°F | Wt 226.0 lb

## 2019-01-27 DIAGNOSIS — Z1211 Encounter for screening for malignant neoplasm of colon: Secondary | ICD-10-CM | POA: Diagnosis not present

## 2019-01-27 DIAGNOSIS — E1165 Type 2 diabetes mellitus with hyperglycemia: Secondary | ICD-10-CM

## 2019-01-27 LAB — POCT GLYCOSYLATED HEMOGLOBIN (HGB A1C): Hemoglobin A1C: 7.8 % — AB (ref 4.0–5.6)

## 2019-01-27 NOTE — Progress Notes (Signed)
  Subjective:     Patient ID: Todd English, male   DOB: 08-19-1970, 49 y.o.   MRN: [id number]  HPI Todd English is seen for medical follow-up.  He has type 2 diabetes, hypertension, hyperlipidemia.  He states he is compliant with medications.  He is working very long days and several days per week and not exercising regularly.  Poor compliance with diet past few months.  His diabetes been suboptimal control.  Last A1c was 7.8%.  He preferred lifestyle modification versus additional medications.  He has not been monitoring blood pressure regularly.  No recent chest pains or dyspnea.  Turn 48 this year.  Needs screening colonoscopy.  No recent stool changes  Past Medical History:  Diagnosis Date  . Allergy   . Depression   . Diabetes mellitus   . GERD (gastroesophageal reflux disease)    History reviewed. No pertinent surgical history.  reports that he quit smoking about 9 years ago. His smoking use included cigarettes. He has a 15.00 pack-year smoking history. He has never used smokeless tobacco. He reports current alcohol use. No history on file for drug. family history is not on file. No Known Allergies  Review of Systems  Constitutional: Negative for appetite change, fatigue and unexpected weight change.  Eyes: Negative for visual disturbance.  Respiratory: Negative for cough, chest tightness and shortness of breath.   Cardiovascular: Negative for chest pain, palpitations and leg swelling.  Gastrointestinal: Negative for blood in stool.  Endocrine: Negative for polydipsia and polyuria.  Neurological: Negative for dizziness, syncope, weakness, light-headedness and headaches.       Objective:   Physical Exam Vitals reviewed.  Constitutional:      Appearance: Normal appearance. He is well-developed.  HENT:     Right Ear: External ear normal.     Left Ear: External ear normal.  Eyes:     Pupils: Pupils are equal, round, and reactive to light.  Neck:     Thyroid: No  thyromegaly.  Cardiovascular:     Rate and Rhythm: Normal rate and regular rhythm.  Pulmonary:     Effort: Pulmonary effort is normal. No respiratory distress.     Breath sounds: Normal breath sounds. No wheezing or rales.  Musculoskeletal:     Cervical back: Neck supple.     Right lower leg: No edema.     Left lower leg: No edema.  Neurological:     Mental Status: He is alert and oriented to person, place, and time.        Assessment:     #1 hypertension suboptimally controlled.  Repeat left arm seated after rest was same as nurse  #2 type 2 diabetes suboptimal control with A1c today 7.8%  #3 hyperlipidemia treated with simvastatin  #4 health maintenance-patient needs screening colonoscopy    Plan:     -GI referral made for colonoscopy -Long discussion with patient regarding options for blood pressure and diabetes management.  He is requesting 36-month trial of losing weight to see if he can control both.  If not better controlled at that point will need additional medication-with consideration for DPP 4 inhibitor versus SGLT2 medication most likely -Consider ACE inhibitor or angiotensin receptor blocker at follow-up if not better to goal with blood pressure  [name] MD [facility]

## 2019-01-27 NOTE — Patient Instructions (Signed)
Lose some weight  Keep sugars and starches down and let's plan on 3 month follow up  If BP and A1C not better by then we need to look at additional medication.

## 2019-02-01 ENCOUNTER — Encounter: Payer: Self-pay | Admitting: Gastroenterology

## 2019-02-20 ENCOUNTER — Other Ambulatory Visit: Payer: Self-pay | Admitting: Physician Assistant

## 2019-02-20 ENCOUNTER — Other Ambulatory Visit: Payer: Self-pay

## 2019-02-20 ENCOUNTER — Inpatient Hospital Stay (HOSPITAL_COMMUNITY)
Admission: AD | Admit: 2019-02-20 | Discharge: 2019-02-23 | DRG: 177 | Disposition: A | Discharge status: Home / Self Care | Payer: 59 | Source: Ambulatory Visit | Attending: Internal Medicine | Admitting: Internal Medicine

## 2019-02-20 ENCOUNTER — Encounter (HOSPITAL_COMMUNITY): Payer: Self-pay | Admitting: Internal Medicine

## 2019-02-20 ENCOUNTER — Telehealth (INDEPENDENT_AMBULATORY_CARE_PROVIDER_SITE_OTHER): Payer: 59 | Admitting: Family Medicine

## 2019-02-20 ENCOUNTER — Encounter (HOSPITAL_COMMUNITY): Payer: Self-pay | Admitting: *Deleted

## 2019-02-20 ENCOUNTER — Telehealth: Payer: Self-pay | Admitting: Physician Assistant

## 2019-02-20 ENCOUNTER — Ambulatory Visit (HOSPITAL_COMMUNITY)
Admission: RE | Admit: 2019-02-20 | Discharge: 2019-02-20 | Disposition: A | Discharge status: Home / Self Care | Payer: 59 | Source: Ambulatory Visit | Attending: Pulmonary Disease | Admitting: Pulmonary Disease

## 2019-02-20 ENCOUNTER — Inpatient Hospital Stay (HOSPITAL_COMMUNITY): Payer: 59

## 2019-02-20 DIAGNOSIS — R0602 Shortness of breath: Secondary | ICD-10-CM | POA: Diagnosis present

## 2019-02-20 DIAGNOSIS — Z7982 Long term (current) use of aspirin: Secondary | ICD-10-CM

## 2019-02-20 DIAGNOSIS — Z833 Family history of diabetes mellitus: Secondary | ICD-10-CM | POA: Diagnosis not present

## 2019-02-20 DIAGNOSIS — Z87891 Personal history of nicotine dependence: Secondary | ICD-10-CM

## 2019-02-20 DIAGNOSIS — E1165 Type 2 diabetes mellitus with hyperglycemia: Secondary | ICD-10-CM | POA: Diagnosis not present

## 2019-02-20 DIAGNOSIS — T380X5A Adverse effect of glucocorticoids and synthetic analogues, initial encounter: Secondary | ICD-10-CM | POA: Diagnosis not present

## 2019-02-20 DIAGNOSIS — J1282 Pneumonia due to coronavirus disease 2019: Secondary | ICD-10-CM | POA: Diagnosis present

## 2019-02-20 DIAGNOSIS — U071 COVID-19: Secondary | ICD-10-CM

## 2019-02-20 DIAGNOSIS — K219 Gastro-esophageal reflux disease without esophagitis: Secondary | ICD-10-CM | POA: Diagnosis present

## 2019-02-20 DIAGNOSIS — Z8249 Family history of ischemic heart disease and other diseases of the circulatory system: Secondary | ICD-10-CM | POA: Diagnosis not present

## 2019-02-20 DIAGNOSIS — Z7984 Long term (current) use of oral hypoglycemic drugs: Secondary | ICD-10-CM

## 2019-02-20 DIAGNOSIS — E785 Hyperlipidemia, unspecified: Secondary | ICD-10-CM | POA: Diagnosis present

## 2019-02-20 DIAGNOSIS — J9601 Acute respiratory failure with hypoxia: Secondary | ICD-10-CM | POA: Diagnosis present

## 2019-02-20 DIAGNOSIS — IMO0002 Reserved for concepts with insufficient information to code with codable children: Secondary | ICD-10-CM | POA: Diagnosis present

## 2019-02-20 LAB — COMPREHENSIVE METABOLIC PANEL
ALT: 60 U/L — ABNORMAL HIGH (ref 0–44)
AST: 57 U/L — ABNORMAL HIGH (ref 15–41)
Albumin: 3.6 g/dL (ref 3.5–5.0)
Alkaline Phosphatase: 129 U/L — ABNORMAL HIGH (ref 38–126)
Anion gap: 14 (ref 5–15)
BUN: 28 mg/dL — ABNORMAL HIGH (ref 6–20)
CO2: 30 mmol/L (ref 22–32)
Calcium: 8.4 mg/dL — ABNORMAL LOW (ref 8.9–10.3)
Chloride: 101 mmol/L (ref 98–111)
Creatinine, Ser: 1.53 mg/dL — ABNORMAL HIGH (ref 0.61–1.24)
GFR calc Af Amer: >60 mL/min (ref >60)
GFR calc non Af Amer: 52 mL/min — ABNORMAL LOW (ref >60)
Glucose, Bld: 279 mg/dL — ABNORMAL HIGH (ref 70–99)
Potassium: 3 mmol/L — ABNORMAL LOW (ref 3.5–5.1)
Sodium: 145 mmol/L (ref 135–145)
Total Bilirubin: 0.8 mg/dL (ref 0.3–1.2)
Total Protein: 7.4 g/dL (ref 6.5–8.1)

## 2019-02-20 LAB — PROCALCITONIN: Procalcitonin: 0.12 ng/mL

## 2019-02-20 LAB — D-DIMER, QUANTITATIVE: D-Dimer, Quant: 1.79 ug/mL-FEU — ABNORMAL HIGH (ref 0.00–0.50)

## 2019-02-20 LAB — GLUCOSE, CAPILLARY
Glucose-Capillary: 209 mg/dL — ABNORMAL HIGH (ref 70–99)
Glucose-Capillary: 247 mg/dL — ABNORMAL HIGH (ref 70–99)

## 2019-02-20 LAB — TYPE AND SCREEN
ABO/RH(D): B POS
Antibody Screen: NEGATIVE

## 2019-02-20 LAB — HEMOGLOBIN A1C
Hgb A1c MFr Bld: 7.8 % — ABNORMAL HIGH (ref 4.8–5.6)
Mean Plasma Glucose: 177.16 mg/dL

## 2019-02-20 LAB — ABO/RH: ABO/RH(D): B POS

## 2019-02-20 LAB — C-REACTIVE PROTEIN: CRP: 14 mg/dL — ABNORMAL HIGH (ref <1.0)

## 2019-02-20 LAB — LACTATE DEHYDROGENASE: LDH: 445 U/L — ABNORMAL HIGH (ref 98–192)

## 2019-02-20 MED ORDER — SODIUM CHLORIDE 0.9% FLUSH: 3.0000 mL | INTRAVENOUS | Status: DC | PRN

## 2019-02-20 MED ORDER — SODIUM CHLORIDE 0.9 % IV SOLN
700.0000 mg | Freq: Once | INTRAVENOUS | Status: DC
  Filled 2019-02-20: qty 20

## 2019-02-20 MED ORDER — SODIUM CHLORIDE 0.9 % IV SOLN: INTRAVENOUS | Status: DC | PRN

## 2019-02-20 MED ORDER — ASPIRIN EC 81 MG PO TBEC
81.0000 mg | DELAYED_RELEASE_TABLET | Freq: Every day | ORAL | Status: DC
  Administered 2019-02-21 – 2019-02-23 (×3): 81 mg via ORAL
  Filled 2019-02-20 (×3): qty 1

## 2019-02-20 MED ORDER — ENOXAPARIN SODIUM 40 MG/0.4ML ~~LOC~~ SOLN: 40.0000 mg | SUBCUTANEOUS | Status: DC

## 2019-02-20 MED ORDER — METHYLPREDNISOLONE SODIUM SUCC 40 MG IJ SOLR
40.0000 mg | Freq: Two times a day (BID) | INTRAMUSCULAR | Status: DC
  Administered 2019-02-20 – 2019-02-23 (×6): 40 mg via INTRAVENOUS
  Filled 2019-02-20 (×6): qty 1

## 2019-02-20 MED ORDER — INSULIN ASPART 100 UNIT/ML ~~LOC~~ SOLN: 0.0000 [IU] | Freq: Every day | SUBCUTANEOUS | Status: DC

## 2019-02-20 MED ORDER — INSULIN DETEMIR 100 UNIT/ML ~~LOC~~ SOLN
20.0000 [IU] | Freq: Every day | SUBCUTANEOUS | Status: DC
  Administered 2019-02-20: 20 [IU] via SUBCUTANEOUS
  Filled 2019-02-20: qty 0.2

## 2019-02-20 MED ORDER — ONDANSETRON HCL 4 MG PO TABS: 4.0000 mg | ORAL_TABLET | Freq: Four times a day (QID) | ORAL | Status: DC | PRN

## 2019-02-20 MED ORDER — SODIUM CHLORIDE 0.9% FLUSH: 3.0000 mL | Freq: Two times a day (BID) | INTRAVENOUS | Status: DC

## 2019-02-20 MED ORDER — ACETAMINOPHEN 325 MG PO TABS: 650.0000 mg | ORAL_TABLET | Freq: Four times a day (QID) | ORAL | Status: DC | PRN

## 2019-02-20 MED ORDER — ALBUTEROL SULFATE HFA 108 (90 BASE) MCG/ACT IN AERS: 2.0000 | INHALATION_SPRAY | Freq: Once | RESPIRATORY_TRACT | Status: DC | PRN

## 2019-02-20 MED ORDER — FAMOTIDINE IN NACL 20-0.9 MG/50ML-% IV SOLN: 20.0000 mg | Freq: Once | INTRAVENOUS | Status: DC | PRN

## 2019-02-20 MED ORDER — DIPHENHYDRAMINE HCL 50 MG/ML IJ SOLN: 50.0000 mg | Freq: Once | INTRAMUSCULAR | Status: DC | PRN

## 2019-02-20 MED ORDER — ALBUTEROL SULFATE HFA 108 (90 BASE) MCG/ACT IN AERS
1.0000 | INHALATION_SPRAY | RESPIRATORY_TRACT | Status: DC | PRN
  Filled 2019-02-20: qty 6.7

## 2019-02-20 MED ORDER — INSULIN ASPART 100 UNIT/ML ~~LOC~~ SOLN
4.0000 [IU] | Freq: Three times a day (TID) | SUBCUTANEOUS | Status: DC
  Administered 2019-02-20: 4 [IU] via SUBCUTANEOUS

## 2019-02-20 MED ORDER — SODIUM CHLORIDE 0.9 % IV SOLN: 250.0000 mL | INTRAVENOUS | Status: DC | PRN

## 2019-02-20 MED ORDER — ONDANSETRON HCL 4 MG/2ML IJ SOLN: 4.0000 mg | Freq: Four times a day (QID) | INTRAMUSCULAR | Status: DC | PRN

## 2019-02-20 MED ORDER — SIMVASTATIN 20 MG PO TABS
40.0000 mg | ORAL_TABLET | Freq: Every day | ORAL | Status: DC
  Administered 2019-02-21 – 2019-02-23 (×3): 40 mg via ORAL
  Filled 2019-02-20 (×3): qty 2

## 2019-02-20 MED ORDER — POLYETHYLENE GLYCOL 3350 17 G PO PACK: 17.0000 g | PACK | Freq: Every day | ORAL | Status: DC | PRN

## 2019-02-20 MED ORDER — METHYLPREDNISOLONE SODIUM SUCC 125 MG IJ SOLR: 125.0000 mg | Freq: Once | INTRAMUSCULAR | Status: DC | PRN

## 2019-02-20 MED ORDER — SODIUM CHLORIDE 0.9 % IV SOLN
100.0000 mg | Freq: Every day | INTRAVENOUS | Status: DC
  Administered 2019-02-21 – 2019-02-23 (×3): 100 mg via INTRAVENOUS
  Filled 2019-02-20 (×3): qty 20

## 2019-02-20 MED ORDER — INSULIN ASPART 100 UNIT/ML ~~LOC~~ SOLN
0.0000 [IU] | Freq: Three times a day (TID) | SUBCUTANEOUS | Status: DC
  Administered 2019-02-20: 17:00:00 7 [IU] via SUBCUTANEOUS

## 2019-02-20 MED ORDER — INSULIN ASPART 100 UNIT/ML ~~LOC~~ SOLN
0.0000 [IU] | Freq: Every day | SUBCUTANEOUS | Status: DC
  Administered 2019-02-20: 2 [IU] via SUBCUTANEOUS

## 2019-02-20 MED ORDER — INSULIN ASPART 100 UNIT/ML ~~LOC~~ SOLN: 0.0000 [IU] | Freq: Three times a day (TID) | SUBCUTANEOUS | Status: DC

## 2019-02-20 MED ORDER — ACETAMINOPHEN 325 MG PO TABS
650.0000 mg | ORAL_TABLET | Freq: Once | ORAL | Status: AC
  Administered 2019-02-20: 12:00:00 650 mg via ORAL
  Filled 2019-02-20: qty 2

## 2019-02-20 MED ORDER — DEXAMETHASONE SODIUM PHOSPHATE 10 MG/ML IJ SOLN: 6.0000 mg | INTRAMUSCULAR | Status: DC

## 2019-02-20 MED ORDER — EPINEPHRINE 0.3 MG/0.3ML IJ SOAJ: 0.3000 mg | Freq: Once | INTRAMUSCULAR | Status: DC | PRN

## 2019-02-20 MED ORDER — SODIUM CHLORIDE 0.9 % IV SOLN
200.0000 mg | Freq: Once | INTRAVENOUS | Status: AC
  Administered 2019-02-20: 16:00:00 200 mg via INTRAVENOUS
  Filled 2019-02-20: qty 40

## 2019-02-20 MED ORDER — SODIUM CHLORIDE 0.9% IV SOLUTION: Freq: Once | INTRAVENOUS | Status: DC

## 2019-02-20 NOTE — H&P (Signed)
History and Physical    Todd English [id number] DOB: 1970-07-07 DOA: 02/20/2019  PCP: [name], MD  Patient coming from: home   Chief Complaint: home  HPI: Todd English is a 49 y.o. male with medical history significant of  diabetes mellitus type 2 started feeling poorly about 7 days prior to admission started feeling short of breath about 3 days prior to admission had a telemedicine visit with his primary care doctor referred him to the infusion clinic to get Casirivimab and imdevimab was found to be hypoxic and referred for admission. He denies any nausea vomiting chest pain or diarrhea. But he does relate shortness of breath disorder about 3 days prior to admission especially with ambulation.   Review of Systems: As per HPI otherwise 10 point review of systems negative.    Past Medical History:  Diagnosis Date  . Allergy   . Depression   . Diabetes mellitus   . GERD (gastroesophageal reflux disease)     No past surgical history on file.   reports that he quit smoking about 9 years ago. His smoking use included cigarettes. He has a 15.00 pack-year smoking history. He has never used smokeless tobacco. He reports current alcohol use. He reports that he does not use drugs.  No Known Allergies  Family History  Problem Relation Age of Onset  . Diabetes Mellitus II Mother   . Heart attack Father      Prior to Admission medications   Medication Sig Start Date End Date Taking? Authorizing Provider  aspirin EC 81 MG tablet Take 81 mg by mouth daily.   Yes [provider]  Dulaglutide (TRULICITY) A999333 [id number] SOPN INJECT 0.75 MG INTO THE SKIN EVERY 7 (SEVEN) DAYS. Patient taking differently: Inject 0.75 mg as directed every Wednesday.  05/06/18  Yes Burchette, Alinda Sierras, MD  glimepiride (AMARYL) 4 MG tablet TAKE ONE TABLET BY MOUTH ONE TIME DAILY WITH BREAKFAST Patient taking differently: Take 4 mg by mouth daily with breakfast.  05/06/18  Yes Burchette,  Alinda Sierras, MD  metFORMIN (GLUCOPHAGE) 1000 MG tablet Take 1 tablet (1,000 mg total) by mouth 2 (two) times daily with a meal. 05/06/18  Yes Burchette, Alinda Sierras, MD  simvastatin (ZOCOR) 40 MG tablet TAKE 1 TABLET (40 MG TOTAL) BY MOUTH DAILY. Patient taking differently: Take 40 mg by mouth daily.  05/06/18  Yes Burchette, Alinda Sierras, MD  VITAMIN D PO Take 1 capsule by mouth daily.   Yes [provider]  glucose blood test strip One Touch Verio Flex ,  Test up to 4 times daily, Dx E11.9 06/27/14   Burchette, Alinda Sierras, MD  Lancets (ACCU-CHEK SOFT [facility]) lancets May check up 4 times daily E11.9 06/21/14   [name], MD    Physical Exam: Vitals:   02/20/19 1325  BP: 130/83  Pulse: (!) 103  Resp: 18  Temp: 99.4 F (37.4 C)  TempSrc: Oral  SpO2: 97%    Constitutional: NAD, calm, comfortable Vitals:   02/20/19 1325  BP: 130/83  Pulse: (!) 103  Resp: 18  Temp: 99.4 F (37.4 C)  TempSrc: Oral  SpO2: 97%   Eyes: PERRL, lids and conjunctivae normal ENMT: Mucous membranes are moist.  Neck: normal, supple, no masses, no thyromegaly Respiratory: Good air movement and clear to auscultation. Cardiovascular: Regular rate and rhythm, no murmurs / rubs / gallops. No extremity edema. 2+ pedal pulses. No carotid bruits.  Abdomen: no tenderness, no masses palpated. No hepatosplenomegaly. Bowel  sounds positive.  Musculoskeletal: no clubbing / cyanosis. No joint deformity upper and lower extremities. Good ROM, no contractures. Normal muscle tone.  Skin: no rashes, lesions, ulcers. No induration Neurologic: CN 2-12 grossly intact. Sensation intact, DTR normal. Strength 5/5 in all 4.  Psychiatric: Normal judgment and insight. Alert and oriented x 3. Normal mood.     Labs on Admission: I have personally reviewed following labs and imaging studies  CBC: No results for input(s): WBC, NEUTROABS, HGB, HCT, MCV, PLT in the last 168 hours. Basic Metabolic Panel: No results for input(s): NA, K,  CL, CO2, GLUCOSE, BUN, CREATININE, CALCIUM, MG, PHOS in the last 168 hours. GFR: CrCl cannot be calculated (Patient's most recent lab result is older than the maximum 21 days allowed.). Liver Function Tests: No results for input(s): AST, ALT, ALKPHOS, BILITOT, PROT, ALBUMIN in the last 168 hours. No results for input(s): LIPASE, AMYLASE in the last 168 hours. No results for input(s): AMMONIA in the last 168 hours. Coagulation Profile: No results for input(s): INR, PROTIME in the last 168 hours. Cardiac Enzymes: No results for input(s): CKTOTAL, CKMB, CKMBINDEX, TROPONINI in the last 168 hours. BNP (last 3 results) No results for input(s): PROBNP in the last 8760 hours. HbA1C: No results for input(s): HGBA1C in the last 72 hours. CBG: No results for input(s): GLUCAP in the last 168 hours. Lipid Profile: No results for input(s): CHOL, HDL, LDLCALC, TRIG, CHOLHDL, LDLDIRECT in the last 72 hours. Thyroid Function Tests: No results for input(s): TSH, T4TOTAL, FREET4, T3FREE, THYROIDAB in the last 72 hours. Anemia Panel: No results for input(s): VITAMINB12, FOLATE, FERRITIN, TIBC, IRON, RETICCTPCT in the last 72 hours. Urine analysis:    Component Value Date/Time   COLORURINE yellow 12/06/2008 1024   APPEARANCEUR Clear 12/06/2008 1024   LABSPEC 1.015 12/06/2008 1024   PHURINE 5.5 12/06/2008 1024   HGBUR negative 12/06/2008 1024   BILIRUBINUR negative 12/06/2008 1024   UROBILINOGEN 0.2 12/06/2008 1024   NITRITE negative 12/06/2008 1024    Radiological Exams on Admission: Portable chest 1 View  Result Date: 02/20/2019 CLINICAL DATA:  Acute hypoxic respiratory failure due to COVID 19. EXAM: PORTABLE CHEST 1 VIEW COMPARISON:  None. FINDINGS: The cardiac silhouette, mediastinal and hilar contours are within normal limits. Patchy ill-defined bilateral interstitial and airspace infiltrates consistent with COVID pneumonia. No pleural effusion or focal pulmonary lesions. The bony thorax is  intact. IMPRESSION: Bilateral infiltrates consistent with COVID pneumonia. Electronically Signed   By: Marijo Sanes M.D.   On: 02/20/2019 14:13    EKG: Independently reviewed. none  Assessment/Plan Acute respiratory failure with hypoxia due to COVID-19 pneumonia: Agree with oxygen supplementation, will start on IV remdesivir and steroids. I have explained to him the need of these medications, I also talked to him about convalescent plasma which she has agreed. The treatment plan and use of medications and known side effects were discussed with patient/family, they were clearly explained that there is no proven definitive treatment for COVID-19 infection, any medications used here are based on published clinical articles/anecdotal data which are not peer-reviewed or randomized control trials.  Complete risks and long-term side effects are unknown, however in the best clinical judgment they seem to be of some clinical benefit rather than medical risks.  Patient/family agree with the treatment plan and want to receive the given medications. We will check inflammatory markers CRP and D-dimer. Try to keep the patient prone for early 16 hours a day, use incentive spirometry and flutter valve try to  keep strict I's and O's and daily weights.  Type 2 diabetes mellitus, uncontrolled ([place]) Hold his home regimen, will start him on long-acting insulin plus sliding scale. Check an A1c. He will be on steroids probably his oxygen requirements will be high.  Hyperlipidemia Continue statins.   DVT prophylaxis: Lovenox Code Status: Full Family Communication: None Disposition Plan: once he is off the oxygen Consults called: None Admission status: inpatient   Charlynne Cousins MD [facility]  If 7PM-7AM, please contact night-coverage [email or web address] Use universal [place] password for that web site. If you do not have the password, please call the hospital operator.  02/20/2019, 2:19 PM

## 2019-02-20 NOTE — Progress Notes (Signed)
1202 Pt arrived to infusion clinic A&Ox4, ambulated and sitting in recliner, tachypneic, temp 102.2, tylenol given per MAR, pt hypoxic, O2 sats in the 80s, 2L [place] applied, O2 sats now in the mid 90s. MD paged for additional orders.   1245 Admission orders received.   1315 Pt transferred to room 159 via wheelchair and [facility]. Pt A&Ox4, ambulated in room, bedside report given to Seth Bake, Therapist, sports.

## 2019-02-20 NOTE — Progress Notes (Signed)
This visit type was conducted due to national recommendations for restrictions regarding the COVID-19 pandemic in an effort to limit this patient's exposure and mitigate transmission in our community.   Virtual Visit via Video Note  I connected with Todd English on 02/20/19 at  9:00 AM EST by a video enabled telemedicine application and verified that I am speaking with the correct person using two identifiers.  Location patient: home Location provider:work or home office Persons participating in the virtual visit: patient, provider  I discussed the limitations of evaluation and management by telemedicine and the availability of in person appointments. The patient expressed understanding and agreed to proceed.   HPI: Todd English has Covid.  He was diagnosed last Tuesday.  Onset of symptoms Monday (one week ago).  He has had headache, body aches, fever, chills, loss of appetite.  His wife also was diagnosed.  He does not have any dyspnea at rest or with basic activities.  He does not have home pulse oximeter.  He does have type 2 diabetes which is his major risk factor.  No chronic lung disease.  No heart disease history.  Is keeping down fluids   ROS: See pertinent positives and negatives per HPI.  Past Medical History:  Diagnosis Date  . Allergy   . Depression   . Diabetes mellitus   . GERD (gastroesophageal reflux disease)     No past surgical history on file.  No family history on file.  SOCIAL HX: Former smoker.  Quit 2011   Current Outpatient Medications:  .  Dulaglutide (TRULICITY) A999333 [id number] SOPN, INJECT 0.75 MG INTO THE SKIN EVERY 7 (SEVEN) DAYS., Disp: 12 pen, Rfl: 3 .  glimepiride (AMARYL) 4 MG tablet, TAKE ONE TABLET BY MOUTH ONE TIME DAILY WITH BREAKFAST, Disp: 90 tablet, Rfl: 3 .  glucose blood test strip, One Touch Verio Flex ,  Test up to 4 times daily, Dx E11.9, Disp: 100 each, Rfl: 12 .  Lancets (ACCU-CHEK SOFT TOUCH) lancets, May check up 4 times daily E11.9,  Disp: 100 each, Rfl: 2 .  metFORMIN (GLUCOPHAGE) 1000 MG tablet, Take 1 tablet (1,000 mg total) by mouth 2 (two) times daily with a meal., Disp: 180 tablet, Rfl: 3 .  OVER THE COUNTER MEDICATION, Vitamin d, Disp: , Rfl:  .  simvastatin (ZOCOR) 40 MG tablet, TAKE 1 TABLET (40 MG TOTAL) BY MOUTH DAILY., Disp: 90 tablet, Rfl: 3  EXAM:  VITALS per patient if applicable:  GENERAL: alert, oriented, appears well and in no acute distress  HEENT: atraumatic, conjunttiva clear, no obvious abnormalities on inspection of external nose and ears  NECK: normal movements of the head and neck  LUNGS: on inspection no signs of respiratory distress, breathing rate appears normal, no obvious gross SOB, gasping or wheezing  CV: no obvious cyanosis  MS: moves all visible extremities without noticeable abnormality  PSYCH/NEURO: pleasant and cooperative, no obvious depression or anxiety, speech and thought processing grossly intact  ASSESSMENT AND PLAN:  Discussed the following assessment and plan:  COVID-19 infection which was diagnosed 6 days ago with onset of symptoms 7 days ago.  He has multiple symptoms as above.  He does not feel improved 1 week  into illness.  He has risk factors including history of obesity and type 2 diabetes  -We have given his information to the outpatient infusion center hotline to see if he is a possible candidate for monoclonal antibody infusion. -We suggested they consider home pulse oximeter to monitor oxygen levels -  Plenty of fluids and rest and reemphasized importance of strict quarantine minimum of 10 days from onset of symptoms and at least 24 hours beyond resolution of fever     I discussed the assessment and treatment plan with the patient. The patient was provided an opportunity to ask questions and all were answered. The patient agreed with the plan and demonstrated an understanding of the instructions.   The patient was advised to call back or seek an in-person  evaluation if the symptoms worsen or if the condition fails to improve as anticipated.     Carolann Littler, MD

## 2019-02-20 NOTE — Plan of Care (Signed)

## 2019-02-20 NOTE — Telephone Encounter (Signed)
Thank you!  Really appreciate all you are doing!

## 2019-02-20 NOTE — Telephone Encounter (Signed)
  I connected by phone with Todd English on 02/20/2019 at 9:44 AM to discuss the potential use of an new treatment for mild to moderate COVID-19 viral infection in non-hospitalized patients.  This patient is a 49 y.o. male that meets the FDA criteria for Emergency Use Authorization of bamlanivimab or casirivimab\imdevimab.  Has a (+) direct SARS-CoV-2 viral test result  Has mild or moderate COVID-19   Is ? 49 years of age and weighs ? 40 kg  Is NOT hospitalized due to COVID-19  Is NOT requiring oxygen therapy or requiring an increase in baseline oxygen flow rate due to COVID-19  Is within 10 days of symptom onset  Has at least one of the high risk factor(s) for progression to severe COVID-19 and/or hospitalization as defined in EUA.  Specific high risk criteria : Diabetes   I have spoken and communicated the following to the patient or parent/caregiver:  1. FDA has authorized the emergency use of bamlanivimab and casirivimab\imdevimab for the treatment of mild to moderate COVID-19 in adults and pediatric patients with positive results of direct SARS-CoV-2 viral testing who are 49 years of age and older weighing at least 40 kg, and who are at high risk for progressing to severe COVID-19 and/or hospitalization.  2. The significant known and potential risks and benefits of bamlanivimab and casirivimab\imdevimab, and the extent to which such potential risks and benefits are unknown.  3. Information on available alternative treatments and the risks and benefits of those alternatives, including clinical trials.  4. Patients treated with bamlanivimab and casirivimab\imdevimab should continue to self-isolate and use infection control measures (e.g., wear mask, isolate, social distance, avoid sharing personal items, clean and disinfect "high touch" surfaces, and frequent handwashing) according to CDC guidelines.   5. The patient or parent/caregiver has the option to accept or refuse  bamlanivimab or casirivimab\imdevimab .  After reviewing this information with the patient, The patient agreed to proceed with receiving the bamlanimivab infusion and will be provided a copy of the Fact sheet prior to receiving the infusion.   Pt is set up for today 02/20/19 @ 12:30pm. Sx onset 2/8. Pt will bring copy of positive test from [facility]. Orders in. Directions given.   Angelena Form PA-C 02/20/2019 9:44 AM

## 2019-02-21 LAB — COMPREHENSIVE METABOLIC PANEL
ALT: 43 U/L (ref 0–44)
AST: 41 U/L (ref 15–41)
Albumin: 3.1 g/dL — ABNORMAL LOW (ref 3.5–5.0)
Alkaline Phosphatase: 44 U/L (ref 38–126)
Anion gap: 12 (ref 5–15)
BUN: 26 mg/dL — ABNORMAL HIGH (ref 6–20)
CO2: 28 mmol/L (ref 22–32)
Calcium: 8.8 mg/dL — ABNORMAL LOW (ref 8.9–10.3)
Chloride: 94 mmol/L — ABNORMAL LOW (ref 98–111)
Creatinine, Ser: 1.28 mg/dL — ABNORMAL HIGH (ref 0.61–1.24)
GFR calc Af Amer: >60 mL/min (ref >60)
GFR calc non Af Amer: >60 mL/min (ref >60)
Glucose, Bld: 187 mg/dL — ABNORMAL HIGH (ref 70–99)
Potassium: 4.3 mmol/L (ref 3.5–5.1)
Sodium: 134 mmol/L — ABNORMAL LOW (ref 135–145)
Total Bilirubin: 0.7 mg/dL (ref 0.3–1.2)
Total Protein: 7.5 g/dL (ref 6.5–8.1)

## 2019-02-21 LAB — CBC WITH DIFFERENTIAL/PLATELET
Abs Immature Granulocytes: 0.13 10*3/uL — ABNORMAL HIGH (ref 0.00–0.07)
Basophils Absolute: 0 10*3/uL (ref 0.0–0.1)
Basophils Relative: 0 %
Eosinophils Absolute: 0 10*3/uL (ref 0.0–0.5)
Eosinophils Relative: 0 %
HCT: 43.7 % (ref 39.0–52.0)
Hemoglobin: 13.5 g/dL (ref 13.0–17.0)
Immature Granulocytes: 1 %
Lymphocytes Relative: 9 %
Lymphs Abs: 0.8 10*3/uL (ref 0.7–4.0)
MCH: 24.9 pg — ABNORMAL LOW (ref 26.0–34.0)
MCHC: 30.9 g/dL (ref 30.0–36.0)
MCV: 80.6 fL (ref 80.0–100.0)
Monocytes Absolute: 0.6 10*3/uL (ref 0.1–1.0)
Monocytes Relative: 7 %
Neutro Abs: 7.5 10*3/uL (ref 1.7–7.7)
Neutrophils Relative %: 83 %
Platelets: 384 10*3/uL (ref 150–400)
RBC: 5.42 MIL/uL (ref 4.22–5.81)
RDW: 14.7 % (ref 11.5–15.5)
WBC: 9.1 10*3/uL (ref 4.0–10.5)
nRBC: 0 % (ref 0.0–0.2)

## 2019-02-21 LAB — BPAM FFP
Blood Product Expiration Date: 202102162045
Blood Product Expiration Date: 202102162045
ISSUE DATE / TIME: 202102152137
ISSUE DATE / TIME: 202102152137
Unit Type and Rh: 1700
Unit Type and Rh: 7300

## 2019-02-21 LAB — PREPARE FRESH FROZEN PLASMA

## 2019-02-21 LAB — D-DIMER, QUANTITATIVE: D-Dimer, Quant: 1.37 ug/mL-FEU — ABNORMAL HIGH (ref 0.00–0.50)

## 2019-02-21 LAB — SARS CORONAVIRUS 2 (TAT 6-24 HRS): SARS Coronavirus 2: POSITIVE — AB

## 2019-02-21 LAB — GLUCOSE, CAPILLARY: Glucose-Capillary: 194 mg/dL — ABNORMAL HIGH (ref 70–99)

## 2019-02-21 LAB — C-REACTIVE PROTEIN: CRP: 12.1 mg/dL — ABNORMAL HIGH (ref <1.0)

## 2019-02-21 MED ORDER — INSULIN DETEMIR 100 UNIT/ML ~~LOC~~ SOLN
20.0000 [IU] | Freq: Two times a day (BID) | SUBCUTANEOUS | Status: DC
  Administered 2019-02-21 – 2019-02-23 (×5): 20 [IU] via SUBCUTANEOUS
  Filled 2019-02-21 (×6): qty 0.2

## 2019-02-21 MED ORDER — ENOXAPARIN SODIUM 40 MG/0.4ML ~~LOC~~ SOLN
40.0000 mg | SUBCUTANEOUS | Status: DC
  Administered 2019-02-21 – 2019-02-23 (×3): 40 mg via SUBCUTANEOUS
  Filled 2019-02-21 (×3): qty 0.4

## 2019-02-21 MED ORDER — INSULIN ASPART 100 UNIT/ML ~~LOC~~ SOLN
0.0000 [IU] | Freq: Every day | SUBCUTANEOUS | Status: DC
  Administered 2019-02-21: 3 [IU] via SUBCUTANEOUS

## 2019-02-21 MED ORDER — INSULIN ASPART 100 UNIT/ML ~~LOC~~ SOLN
0.0000 [IU] | Freq: Three times a day (TID) | SUBCUTANEOUS | Status: DC
  Administered 2019-02-21: 4 [IU] via SUBCUTANEOUS
  Administered 2019-02-21: 12:00:00 7 [IU] via SUBCUTANEOUS
  Administered 2019-02-21: 15 [IU] via SUBCUTANEOUS
  Administered 2019-02-22: 17:00:00 7 [IU] via SUBCUTANEOUS
  Administered 2019-02-22: 12:00:00 4 [IU] via SUBCUTANEOUS
  Administered 2019-02-23: 7 [IU] via SUBCUTANEOUS

## 2019-02-21 MED ORDER — INSULIN ASPART 100 UNIT/ML ~~LOC~~ SOLN
10.0000 [IU] | Freq: Three times a day (TID) | SUBCUTANEOUS | Status: DC
  Administered 2019-02-21 – 2019-02-23 (×8): 10 [IU] via SUBCUTANEOUS

## 2019-02-21 NOTE — Progress Notes (Signed)
[facility] PROGRESS NOTE    Progress Note  Todd English  [id number] DOB: 1970-08-14 DOA: 02/20/2019 PCP: [name], MD     Brief Narrative:   Todd English is an 49 y.o. male with medical history significant of  diabetes mellitus type 2 started feeling poorly about 7 days prior to admission started feeling short of breath about 3 days prior to admission had a telemedicine visit with his primary care doctor referred him to the infusion clinic to get Casirivimab and imdevimab was found to be hypoxic and referred for admission. He denies any nausea vomiting chest pain or diarrhea. But he does relate shortness of breath disorder about 3 days prior to admission especially with ambulation.  Assessment/Plan:   Acute respiratory failure with hypoxia secondarily to pneumonia due to COVID-19: Placed on 2 L of oxygen on admission he was started on IV remdesivir and steroids he received 2 units of convalescent plasma. He is now being weaned to room air his inflammatory markers are significant be elevated, will continue to monitor closely. Try to keep the patient prone for at least 16 hours a day, if not prone out of bed to chair, continue to encourage incentive spirometry and flutter valve.  Type 2 diabetes mellitus, uncontrolled (HCC) 1C of 7.8, he is on steroids which will make his blood glucose erratic, oral hypoglycemic agents were held on admission he was started on long-acting insulin plus sliding scale his blood glucose is trending up we will have to increase insulin coverage.  Hyperlipidemia Continue statins.  DVT prophylaxis: lovenox Family Communication:none Disposition Plan/Barrier to D/C: Unable to determine.  Code Status:     Code Status Orders  (From admission, onward)         Start     Ordered   02/20/19 1334  Full code  Continuous     02/20/19 1337        Code Status History    Date Active Date Inactive Code Status Order ID Comments User  Context   02/20/2019 1335 02/20/2019 1337 Full Code [id number]  Little Ishikawa, MD Inpatient   Advance Care Planning Activity        IV Access:    Peripheral IV   Procedures and diagnostic studies:   Portable chest 1 View  Result Date: 02/20/2019 CLINICAL DATA:  Acute hypoxic respiratory failure due to COVID 19. EXAM: PORTABLE CHEST 1 VIEW COMPARISON:  None. FINDINGS: The cardiac silhouette, mediastinal and hilar contours are within normal limits. Patchy ill-defined bilateral interstitial and airspace infiltrates consistent with COVID pneumonia. No pleural effusion or focal pulmonary lesions. The bony thorax is intact. IMPRESSION: Bilateral infiltrates consistent with COVID pneumonia. Electronically Signed   By: Marijo Sanes M.D.   On: 02/20/2019 14:13     Medical Consultants:    None.  Anti-Infectives:   IV remdesivir  Subjective:    Todd English he relates his shortness of breath is improved he feels like to take a deeper breath today.  Objective:    Vitals:   02/21/19 0244 02/21/19 0245 02/21/19 0256 02/21/19 0442  BP: 135/89 135/89 117/78 131/78  Pulse:   97 92  Resp:   18 16  Temp:   97.7 F (36.5 C) 97.9 F (36.6 C)  TempSrc:   Oral Oral  SpO2:   90% 92%  Weight:      Height:       SpO2: 92 % O2 Flow Rate (L/min): 1 L/min   Intake/Output Summary (Last  24 hours) at 02/21/2019 0751 Last data filed at 02/21/2019 0245 Gross per 24 hour  Intake 985 ml  Output --  Net 985 ml   Filed Weights   02/20/19 1452  Weight: 93.8 kg    Exam: General exam: In no acute distress. Respiratory system: Good air movement and diffuse crackles mainly on the right. Cardiovascular system: S1 & S2 heard, RRR. No JVD. Gastrointestinal system: Abdomen is nondistended, soft and nontender.  Central nervous system: Alert and oriented. No focal neurological deficits. Extremities: No pedal edema. Skin: No rashes, lesions or ulcers  Data Reviewed:     Labs: Basic Metabolic Panel: Recent Labs  Lab 02/20/19 1415  NA 145  K 3.0*  CL 101  CO2 30  GLUCOSE 279*  BUN 28*  CREATININE 1.53*  CALCIUM 8.4*   GFR Estimated Creatinine Clearance: 68.7 mL/min (A) (by C-G formula based on SCr of 1.53 mg/dL (H)). Liver Function Tests: Recent Labs  Lab 02/20/19 1415  AST 57*  ALT 60*  ALKPHOS 129*  BILITOT 0.8  PROT 7.4  ALBUMIN 3.6   No results for input(s): LIPASE, AMYLASE in the last 168 hours. No results for input(s): AMMONIA in the last 168 hours. Coagulation profile No results for input(s): INR, PROTIME in the last 168 hours. COVID-19 Labs  Recent Labs    02/20/19 1415  DDIMER 1.79*  LDH 445*  CRP 14.0*    Lab Results  Component Value Date   SARSCOV2NAA POSITIVE (A) 02/20/2019    CBC: No results for input(s): WBC, NEUTROABS, HGB, HCT, MCV, PLT in the last 168 hours. Cardiac Enzymes: No results for input(s): CKTOTAL, CKMB, CKMBINDEX, TROPONINI in the last 168 hours. BNP (last 3 results) No results for input(s): PROBNP in the last 8760 hours. CBG: Recent Labs  Lab 02/20/19 1711 02/20/19 2128  GLUCAP 209* 247*   D-Dimer: Recent Labs    02/20/19 1415  DDIMER 1.79*   Hgb A1c: Recent Labs    02/20/19 1415  HGBA1C 7.8*   Lipid Profile: No results for input(s): CHOL, HDL, LDLCALC, TRIG, CHOLHDL, LDLDIRECT in the last 72 hours. Thyroid function studies: No results for input(s): TSH, T4TOTAL, T3FREE, THYROIDAB in the last 72 hours.  Invalid input(s): FREET3 Anemia work up: No results for input(s): VITAMINB12, FOLATE, FERRITIN, TIBC, IRON, RETICCTPCT in the last 72 hours. Sepsis Labs: Recent Labs  Lab 02/20/19 1415  PROCALCITON 0.12   Microbiology Recent Results (from the past 240 hour(s))  SARS CORONAVIRUS 2 (TAT 6-24 HRS) Nasopharyngeal Nasopharyngeal Swab     Status: Abnormal   Collection Time: 02/20/19  4:30 PM   Specimen: Nasopharyngeal Swab  Result Value Ref Range Status   SARS  Coronavirus 2 POSITIVE (A) NEGATIVE Final    Comment: RESULT CALLED TO, READ BACK BY AND VERIFIED WITH: J. MARUEO,RN [id number] 02/21/2019 T. TYSOR (NOTE) SARS-CoV-2 target nucleic acids are DETECTED. The SARS-CoV-2 RNA is generally detectable in upper and lower respiratory specimens during the acute phase of infection. Positive results are indicative of the presence of SARS-CoV-2 RNA. Clinical correlation with patient history and other diagnostic information is  necessary to determine patient infection status. Positive results do not rule out bacterial infection or co-infection with other viruses.  The expected result is Negative. Fact Sheet for Patients: SugarRoll.be Fact Sheet for Healthcare Providers: https://www.woods-mathews.com/ This test is not yet approved or cleared by the [place] FDA and  has been authorized for detection and/or diagnosis of SARS-CoV-2 by FDA under an Emergency Use Authorization (EUA). This  EUA will remain  in effect (meaning this test can be used) for t he duration of the COVID-19 declaration under Section 564(b)(1) of the Act, 21 U.S.C. section 360bbb-3(b)(1), unless the authorization is terminated or revoked sooner. Performed at [facility], [place] [address] [place], [place] [address]      Medications:   . sodium chloride   Intravenous Once  . aspirin EC  81 mg Oral Daily  . enoxaparin (LOVENOX) injection  40 mg Subcutaneous Q24H  . insulin aspart  0-20 Units Subcutaneous TID WC  . insulin aspart  0-5 Units Subcutaneous QHS  . insulin aspart  4 Units Subcutaneous TID WC  . insulin detemir  20 Units Subcutaneous QHS  . methylPREDNISolone (SOLU-MEDROL) injection  40 mg Intravenous Q12H  . simvastatin  40 mg Oral Daily   Continuous Infusions: . remdesivir 100 mg in NS 100 mL        LOS: 1 day   Charlynne Cousins  [facility]  02/21/2019, 7:51 AM

## 2019-02-21 NOTE — Plan of Care (Signed)

## 2019-02-21 NOTE — Progress Notes (Signed)
   02/21/19 1233  Family/Significant Other Communication  Family/Significant Other Update Called;Updated (Wife [place])

## 2019-02-22 ENCOUNTER — Encounter (HOSPITAL_COMMUNITY): Payer: Self-pay | Admitting: Internal Medicine

## 2019-02-22 DIAGNOSIS — E1165 Type 2 diabetes mellitus with hyperglycemia: Secondary | ICD-10-CM

## 2019-02-22 DIAGNOSIS — E785 Hyperlipidemia, unspecified: Secondary | ICD-10-CM

## 2019-02-22 DIAGNOSIS — U071 COVID-19: Principal | ICD-10-CM

## 2019-02-22 DIAGNOSIS — J9601 Acute respiratory failure with hypoxia: Secondary | ICD-10-CM

## 2019-02-22 LAB — GLUCOSE, CAPILLARY
Glucose-Capillary: 218 mg/dL — ABNORMAL HIGH (ref 70–99)
Glucose-Capillary: 312 mg/dL — ABNORMAL HIGH (ref 70–99)
Glucose-Capillary: 266 mg/dL — ABNORMAL HIGH (ref 70–99)
Glucose-Capillary: 99 mg/dL (ref 70–99)
Glucose-Capillary: 177 mg/dL — ABNORMAL HIGH (ref 70–99)

## 2019-02-22 LAB — COMPREHENSIVE METABOLIC PANEL
ALT: 47 U/L — ABNORMAL HIGH (ref 0–44)
AST: 41 U/L (ref 15–41)
Albumin: 3.3 g/dL — ABNORMAL LOW (ref 3.5–5.0)
Alkaline Phosphatase: 43 U/L (ref 38–126)
Anion gap: 11 (ref 5–15)
BUN: 32 mg/dL — ABNORMAL HIGH (ref 6–20)
CO2: 30 mmol/L (ref 22–32)
Calcium: 9.4 mg/dL (ref 8.9–10.3)
Chloride: 98 mmol/L (ref 98–111)
Creatinine, Ser: 1.08 mg/dL (ref 0.61–1.24)
GFR calc Af Amer: >60 mL/min (ref >60)
GFR calc non Af Amer: >60 mL/min (ref >60)
Glucose, Bld: 125 mg/dL — ABNORMAL HIGH (ref 70–99)
Potassium: 4.5 mmol/L (ref 3.5–5.1)
Sodium: 139 mmol/L (ref 135–145)
Total Bilirubin: 0.5 mg/dL (ref 0.3–1.2)
Total Protein: 7.6 g/dL (ref 6.5–8.1)

## 2019-02-22 LAB — CBC WITH DIFFERENTIAL/PLATELET
Abs Immature Granulocytes: 0.21 10*3/uL — ABNORMAL HIGH (ref 0.00–0.07)
Basophils Absolute: 0 10*3/uL (ref 0.0–0.1)
Basophils Relative: 0 %
Eosinophils Absolute: 0 10*3/uL (ref 0.0–0.5)
Eosinophils Relative: 0 %
HCT: 45.7 % (ref 39.0–52.0)
Hemoglobin: 14 g/dL (ref 13.0–17.0)
Immature Granulocytes: 2 %
Lymphocytes Relative: 10 %
Lymphs Abs: 1.2 10*3/uL (ref 0.7–4.0)
MCH: 24.6 pg — ABNORMAL LOW (ref 26.0–34.0)
MCHC: 30.6 g/dL (ref 30.0–36.0)
MCV: 80.5 fL (ref 80.0–100.0)
Monocytes Absolute: 1.1 10*3/uL — ABNORMAL HIGH (ref 0.1–1.0)
Monocytes Relative: 9 %
Neutro Abs: 9.3 10*3/uL — ABNORMAL HIGH (ref 1.7–7.7)
Neutrophils Relative %: 79 %
Platelets: 449 10*3/uL — ABNORMAL HIGH (ref 150–400)
RBC: 5.68 MIL/uL (ref 4.22–5.81)
RDW: 14.6 % (ref 11.5–15.5)
WBC: 11.9 10*3/uL — ABNORMAL HIGH (ref 4.0–10.5)
nRBC: 0 % (ref 0.0–0.2)

## 2019-02-22 LAB — C-REACTIVE PROTEIN: CRP: 6.2 mg/dL — ABNORMAL HIGH (ref <1.0)

## 2019-02-22 LAB — D-DIMER, QUANTITATIVE: D-Dimer, Quant: 1.23 ug/mL-FEU — ABNORMAL HIGH (ref 0.00–0.50)

## 2019-02-22 NOTE — Progress Notes (Signed)
SATURATION QUALIFICATIONS: (This note is used to comply with regulatory documentation for home oxygen)  Patient Saturations on Room Air at Rest = 96%  Patient Saturations on Room Air while Ambulating = 83%  Patient Saturations on 2 Liters of oxygen while Ambulating = 90-91%  Please briefly explain why patient needs home oxygen: Desats with prolonged activity.

## 2019-02-22 NOTE — Progress Notes (Signed)
   02/22/19 1248  Family/Significant Other Communication  Family/Significant Other Update Called (wife [place]. No answer, line busy on cell phone)

## 2019-02-22 NOTE — Progress Notes (Signed)
PROGRESS NOTE    Todd English  [id number] DOB: 22-Feb-1970 DOA: 02/20/2019 PCP: [name], MD   Brief Narrative:  49 year old with history of DM2 presented to the hospital with feeling of worsening shortness of breath.  Was referred outpatient for antibiotic infusion by his PCP where he was noted to be hypoxic.  Upon admission he was started on remdesivir and steroids.  Also received 2 units of convalescent plasma.  Slowly was weaned off oxygen.   Assessment & Plan:   Active Problems:   Type 2 diabetes mellitus, uncontrolled (HCC)   Hyperlipidemia   Acute hypoxemic respiratory failure due to COVID-19 [facility])   Acute respiratory failure with hypoxia (HCC)  Acute hypoxic respiratory failure secondary to COVID-19 pneumonia -Oxygen levels-2-3 L nasal cannula, not on home oxygen -Remdesivir-day 3 -Decadrone-day 3 -Actemra-none -Convalescent plasma-2/15 -Antibiotics-none -procalcitonin-negative -Chest x-ray-bilateral infiltrate -Supportive care-antitussive, inhalers, I-S/flutter -CODE STATUS confirmed -Vitamin C & Zinc. Prone >16hrs/day.  -Routine: Labs have been reviewed including ferritin, LDH, CRP, d-dimer, fibrinogen.  Will need to trend this lab daily.  Diabetes mellitus type 2, uncontrolled secondary to steroids -Hemoglobin A1c 7.8.  Insulin sliding scale Accu-Chek  Hyperlipidemia -Statin   DVT prophylaxis: Lovenox Code Status: Full Family Communication: Spoke with wife over the face time while I was in the room Disposition Plan:   Patient From= home  Patient Anticipated D/C place= Home  Barriers= awaiting completion of his remdesivir treatment and to be able to wean off oxygen.  Discharge home in next 24-48 hours.   Subjective: Feels better today symptomatically.  Some exertional dyspnea but improved compared to yesterday.  Review of Systems Otherwise negative except as per HPI, including: General: Denies fever, chills, night sweats or unintended  weight loss. Resp: Denies hemoptysis Cardiac: Denies chest pain, palpitations, orthopnea, paroxysmal nocturnal dyspnea. GI: Denies abdominal pain, nausea, vomiting, diarrhea or constipation GU: Denies dysuria, frequency, hesitancy or incontinence MS: Denies muscle aches, joint pain or swelling Neuro: Denies headache, neurologic deficits (focal weakness, numbness, tingling), abnormal gait Psych: Denies anxiety, depression, SI/HI/AVH Skin: Denies new rashes or lesions ID: Denies sick contacts, exotic exposures, travel  Examination:  General exam: Appears calm and comfortable, 3 L nasal cannula Respiratory system: Mild bilateral rhonchi Cardiovascular system: S1 & S2 heard, RRR. No JVD, murmurs, rubs, gallops or clicks. No pedal edema. Gastrointestinal system: Abdomen is nondistended, soft and nontender. No organomegaly or masses felt. Normal bowel sounds heard. Central nervous system: Alert and oriented. No focal neurological deficits. Extremities: Symmetric 5 x 5 power. Skin: No rashes, lesions or ulcers Psychiatry: Judgement and insight appear normal. Mood & affect appropriate.     Objective: Vitals:   02/21/19 0803 02/21/19 1629 02/21/19 1920 02/22/19 0745  BP: 126/79 123/83 136/81 130/83  Pulse: 93 91 96 84  Resp: 20 16 16 18   Temp: 98.2 F (36.8 C) (!) 97.1 F (36.2 C) 97.8 F (36.6 C) 97.8 F (36.6 C)  TempSrc: Oral Oral Oral Oral  SpO2: 92% 92% 90% 91%  Weight:      Height:        Intake/Output Summary (Last 24 hours) at 02/22/2019 1034 Last data filed at 02/22/2019 0900 Gross per 24 hour  Intake 240 ml  Output --  Net 240 ml   Filed Weights   02/20/19 1452  Weight: 93.8 kg     Data Reviewed:   CBC: Recent Labs  Lab 02/21/19 0700 02/22/19 0227  WBC 9.1 11.9*  NEUTROABS 7.5 9.3*  HGB 13.5 14.0  HCT  43.7 45.7  MCV 80.6 80.5  PLT 384 [id number]*   Basic Metabolic Panel: Recent Labs  Lab 02/20/19 1415 02/21/19 0700 02/22/19 0227  NA 145 134* 139  K  3.0* 4.3 4.5  CL 101 94* 98  CO2 30 28 30   GLUCOSE 279* 187* 125*  BUN 28* 26* 32*  CREATININE 1.53* 1.28* 1.08  CALCIUM 8.4* 8.8* 9.4   GFR: Estimated Creatinine Clearance: 97.3 mL/min (by C-G formula based on SCr of 1.08 mg/dL). Liver Function Tests: Recent Labs  Lab 02/20/19 1415 02/21/19 0700 02/22/19 0227  AST 57* 41 41  ALT 60* 43 47*  ALKPHOS 129* 44 43  BILITOT 0.8 0.7 0.5  PROT 7.4 7.5 7.6  ALBUMIN 3.6 3.1* 3.3*   No results for input(s): LIPASE, AMYLASE in the last 168 hours. No results for input(s): AMMONIA in the last 168 hours. Coagulation Profile: No results for input(s): INR, PROTIME in the last 168 hours. Cardiac Enzymes: No results for input(s): CKTOTAL, CKMB, CKMBINDEX, TROPONINI in the last 168 hours. BNP (last 3 results) No results for input(s): PROBNP in the last 8760 hours. HbA1C: Recent Labs    02/20/19 1415  HGBA1C 7.8*   CBG: Recent Labs  Lab 02/21/19 0757 02/21/19 1144 02/21/19 1638 02/21/19 2035 02/22/19 0738  GLUCAP 194* 218* 312* 266* 99   Lipid Profile: No results for input(s): CHOL, HDL, LDLCALC, TRIG, CHOLHDL, LDLDIRECT in the last 72 hours. Thyroid Function Tests: No results for input(s): TSH, T4TOTAL, FREET4, T3FREE, THYROIDAB in the last 72 hours. Anemia Panel: No results for input(s): VITAMINB12, FOLATE, FERRITIN, TIBC, IRON, RETICCTPCT in the last 72 hours. Sepsis Labs: Recent Labs  Lab 02/20/19 1415  PROCALCITON 0.12    Recent Results (from the past 240 hour(s))  SARS CORONAVIRUS 2 (TAT 6-24 HRS) Nasopharyngeal Nasopharyngeal Swab     Status: Abnormal   Collection Time: 02/20/19  4:30 PM   Specimen: Nasopharyngeal Swab  Result Value Ref Range Status   SARS Coronavirus 2 POSITIVE (A) NEGATIVE Final    Comment: RESULT CALLED TO, READ BACK BY AND VERIFIED WITH: J. MARUEO,RN [id number] 02/21/2019 T. TYSOR (NOTE) SARS-CoV-2 target nucleic acids are DETECTED. The SARS-CoV-2 RNA is generally detectable in upper and  lower respiratory specimens during the acute phase of infection. Positive results are indicative of the presence of SARS-CoV-2 RNA. Clinical correlation with patient history and other diagnostic information is  necessary to determine patient infection status. Positive results do not rule out bacterial infection or co-infection with other viruses.  The expected result is Negative. Fact Sheet for Patients: SugarRoll.be Fact Sheet for Healthcare Providers: https://www.woods-mathews.com/ This test is not yet approved or cleared by the [place] FDA and  has been authorized for detection and/or diagnosis of SARS-CoV-2 by FDA under an Emergency Use Authorization (EUA). This EUA will remain  in effect (meaning this test can be used) for t he duration of the COVID-19 declaration under Section 564(b)(1) of the Act, 21 U.S.C. section 360bbb-3(b)(1), unless the authorization is terminated or revoked sooner. Performed at [facility], [place] [address] [place], [place] [address]          Radiology Studies: Portable chest 1 View  Result Date: 02/20/2019 CLINICAL DATA:  Acute hypoxic respiratory failure due to COVID 19. EXAM: PORTABLE CHEST 1 VIEW COMPARISON:  None. FINDINGS: The cardiac silhouette, mediastinal and hilar contours are within normal limits. Patchy ill-defined bilateral interstitial and airspace infiltrates consistent with COVID pneumonia. No pleural effusion or focal pulmonary lesions. The bony  thorax is intact. IMPRESSION: Bilateral infiltrates consistent with COVID pneumonia. Electronically Signed   By: Marijo Sanes M.D.   On: 02/20/2019 14:13        Scheduled Meds: . sodium chloride   Intravenous Once  . aspirin EC  81 mg Oral Daily  . enoxaparin (LOVENOX) injection  40 mg Subcutaneous Q24H  . insulin aspart  0-20 Units Subcutaneous TID WC  . insulin aspart  0-5 Units Subcutaneous QHS  . insulin aspart  10 Units  Subcutaneous TID WC  . insulin detemir  20 Units Subcutaneous BID  . methylPREDNISolone (SOLU-MEDROL) injection  40 mg Intravenous Q12H  . simvastatin  40 mg Oral Daily   Continuous Infusions: . remdesivir 100 mg in NS 100 mL 100 mg (02/22/19 0840)     LOS: 2 days   Time spent= 35 mins    Shirla Hodgkiss Arsenio Loader, MD [facility]  If 7PM-7AM, please contact night-coverage  02/22/2019, 10:34 AM

## 2019-02-22 NOTE — Plan of Care (Signed)
  Problem: Respiratory: Goal: Complications related to the disease process, condition or treatment will be avoided or minimized Outcome: Not Progressing   Problem: Respiratory: Goal: Complications related to the disease process, condition or treatment will be avoided or minimized Outcome: Not Progressing  Desats with activity.

## 2019-02-23 LAB — COMPREHENSIVE METABOLIC PANEL
ALT: 65 U/L — ABNORMAL HIGH (ref 0–44)
AST: 54 U/L — ABNORMAL HIGH (ref 15–41)
Albumin: 3.2 g/dL — ABNORMAL LOW (ref 3.5–5.0)
Alkaline Phosphatase: 50 U/L (ref 38–126)
Anion gap: 12 (ref 5–15)
BUN: 27 mg/dL — ABNORMAL HIGH (ref 6–20)
CO2: 28 mmol/L (ref 22–32)
Calcium: 9 mg/dL (ref 8.9–10.3)
Chloride: 99 mmol/L (ref 98–111)
Creatinine, Ser: 1.1 mg/dL (ref 0.61–1.24)
GFR calc Af Amer: >60 mL/min (ref >60)
GFR calc non Af Amer: >60 mL/min (ref >60)
Glucose, Bld: 87 mg/dL (ref 70–99)
Potassium: 4.5 mmol/L (ref 3.5–5.1)
Sodium: 139 mmol/L (ref 135–145)
Total Bilirubin: 0.5 mg/dL (ref 0.3–1.2)
Total Protein: 7.5 g/dL (ref 6.5–8.1)

## 2019-02-23 LAB — D-DIMER, QUANTITATIVE: D-Dimer, Quant: 0.61 ug/mL-FEU — ABNORMAL HIGH (ref 0.00–0.50)

## 2019-02-23 LAB — GLUCOSE, CAPILLARY
Glucose-Capillary: 215 mg/dL — ABNORMAL HIGH (ref 70–99)
Glucose-Capillary: 137 mg/dL — ABNORMAL HIGH (ref 70–99)
Glucose-Capillary: 117 mg/dL — ABNORMAL HIGH (ref 70–99)
Glucose-Capillary: 110 mg/dL — ABNORMAL HIGH (ref 70–99)
Glucose-Capillary: 116 mg/dL — ABNORMAL HIGH (ref 70–99)
Glucose-Capillary: 251 mg/dL — ABNORMAL HIGH (ref 70–99)

## 2019-02-23 LAB — CBC WITH DIFFERENTIAL/PLATELET
Abs Immature Granulocytes: 0.32 10*3/uL — ABNORMAL HIGH (ref 0.00–0.07)
Basophils Absolute: 0.1 10*3/uL (ref 0.0–0.1)
Basophils Relative: 1 %
Eosinophils Absolute: 0 10*3/uL (ref 0.0–0.5)
Eosinophils Relative: 0 %
HCT: 47.3 % (ref 39.0–52.0)
Hemoglobin: 14.2 g/dL (ref 13.0–17.0)
Immature Granulocytes: 2 %
Lymphocytes Relative: 9 %
Lymphs Abs: 1.2 10*3/uL (ref 0.7–4.0)
MCH: 24.6 pg — ABNORMAL LOW (ref 26.0–34.0)
MCHC: 30 g/dL (ref 30.0–36.0)
MCV: 82 fL (ref 80.0–100.0)
Monocytes Absolute: 1.2 10*3/uL — ABNORMAL HIGH (ref 0.1–1.0)
Monocytes Relative: 8 %
Neutro Abs: 11.3 10*3/uL — ABNORMAL HIGH (ref 1.7–7.7)
Neutrophils Relative %: 80 %
Platelets: 529 10*3/uL — ABNORMAL HIGH (ref 150–400)
RBC: 5.77 MIL/uL (ref 4.22–5.81)
RDW: 14.9 % (ref 11.5–15.5)
WBC: 14.1 10*3/uL — ABNORMAL HIGH (ref 4.0–10.5)
nRBC: 0 % (ref 0.0–0.2)

## 2019-02-23 LAB — C-REACTIVE PROTEIN: CRP: 2 mg/dL — ABNORMAL HIGH (ref <1.0)

## 2019-02-23 MED ORDER — DEXAMETHASONE 4 MG PO TABS: 4.0000 mg | ORAL_TABLET | Freq: Every day | ORAL | 0 refills | Status: AC

## 2019-02-23 MED ORDER — ALBUTEROL SULFATE HFA 108 (90 BASE) MCG/ACT IN AERS: 1.0000 | INHALATION_SPRAY | RESPIRATORY_TRACT | 0 refills | Status: DC | PRN

## 2019-02-23 NOTE — Discharge Instructions (Signed)
Person Under Monitoring Name: Todd English  Location: [place] [id number]   Infection Prevention Recommendations for Individuals Confirmed to have, or Being Evaluated for, 2019 Novel Coronavirus (COVID-19) Infection Who Receive Care at Home  Individuals who are confirmed to have, or are being evaluated for, COVID-19 should follow the prevention steps below until a healthcare provider or local or state health department says they can return to normal activities.  Stay home except to get medical care You should restrict activities outside your home, except for getting medical care. Do not go to work, school, or public areas, and do not use public transportation or taxis.  Call ahead before visiting your doctor Before your medical appointment, call the healthcare provider and tell them that you have, or are being evaluated for, COVID-19 infection. This will help the healthcare provider's office take steps to keep other people from getting infected. Ask your healthcare provider to call the local or state health department.  Monitor your symptoms Seek prompt medical attention if your illness is worsening (e.g., difficulty breathing). Before going to your medical appointment, call the healthcare provider and tell them that you have, or are being evaluated for, COVID-19 infection. Ask your healthcare provider to call the local or state health department.  Wear a facemask You should wear a facemask that covers your nose and mouth when you are in the same room with other people and when you visit a healthcare provider. People who live with or visit you should also wear a facemask while they are in the same room with you.  Separate yourself from other people in your home As much as possible, you should stay in a different room from other people in your home. Also, you should use a separate bathroom, if available.  Avoid sharing household items You should not  share dishes, drinking glasses, cups, eating utensils, towels, bedding, or other items with other people in your home. After using these items, you should wash them thoroughly with soap and water.  Cover your coughs and sneezes Cover your mouth and nose with a tissue when you cough or sneeze, or you can cough or sneeze into your sleeve. Throw used tissues in a lined trash can, and immediately wash your hands with soap and water for at least 20 seconds or use an alcohol-based hand rub.  Wash your [employer] [employer] your hands often and thoroughly with soap and water for at least 20 seconds. You can use an alcohol-based hand sanitizer if soap and water are not available and if your hands are not visibly dirty. Avoid touching your eyes, nose, and mouth with unwashed hands.   Prevention Steps for Caregivers and Household Members of Individuals Confirmed to have, or Being Evaluated for, COVID-19 Infection Being Cared for in the Home  If you live with, or provide care at home for, a person confirmed to have, or being evaluated for, COVID-19 infection please follow these guidelines to prevent infection:  Follow healthcare provider's instructions Make sure that you understand and can help the patient follow any healthcare provider instructions for all care.  Provide for the patient's basic needs You should help the patient with basic needs in the home and provide support for getting groceries, prescriptions, and other personal needs.  Monitor the patient's symptoms If they are getting sicker, call his or her medical provider and tell them that the patient has, or is being evaluated for, COVID-19 infection. This will help the healthcare provider's office  take steps to keep other people from getting infected. Ask the healthcare provider to call the local or state health department.  Limit the number of people who have contact with the patient  If possible, have only one caregiver for the  patient.  Other household members should stay in another home or place of residence. If this is not possible, they should stay  in another room, or be separated from the patient as much as possible. Use a separate bathroom, if available.  Restrict visitors who do not have an essential need to be in the home.  Keep older adults, very young children, and other sick people away from the patient Keep older adults, very young children, and those who have compromised immune systems or chronic health conditions away from the patient. This includes people with chronic heart, lung, or kidney conditions, diabetes, and cancer.  Ensure good ventilation Make sure that shared spaces in the home have good air flow, such as from an air conditioner or an opened window, weather permitting.  Wash your hands often  Wash your hands often and thoroughly with soap and water for at least 20 seconds. You can use an alcohol based hand sanitizer if soap and water are not available and if your hands are not visibly dirty.  Avoid touching your eyes, nose, and mouth with unwashed hands.  Use disposable paper towels to dry your hands. If not available, use dedicated cloth towels and replace them when they become wet.  Wear a facemask and gloves  Wear a disposable facemask at all times in the room and gloves when you touch or have contact with the patient's blood, body fluids, and/or secretions or excretions, such as sweat, saliva, sputum, nasal mucus, vomit, urine, or feces.  Ensure the mask fits over your nose and mouth tightly, and do not touch it during use.  Throw out disposable facemasks and gloves after using them. Do not reuse.  Wash your hands immediately after removing your facemask and gloves.  If your personal clothing becomes contaminated, carefully remove clothing and launder. Wash your hands after handling contaminated clothing.  Place all used disposable facemasks, gloves, and other waste in a lined  container before disposing them with other household waste.  Remove gloves and wash your hands immediately after handling these items.  Do not share dishes, glasses, or other household items with the patient  Avoid sharing household items. You should not share dishes, drinking glasses, cups, eating utensils, towels, bedding, or other items with a patient who is confirmed to have, or being evaluated for, COVID-19 infection.  After the person uses these items, you should wash them thoroughly with soap and water.  Wash laundry thoroughly  Immediately remove and wash clothes or bedding that have blood, body fluids, and/or secretions or excretions, such as sweat, saliva, sputum, nasal mucus, vomit, urine, or feces, on them.  Wear gloves when handling laundry from the patient.  Read and follow directions on labels of laundry or clothing items and detergent. In general, wash and dry with the warmest temperatures recommended on the label.  Clean all areas the individual has used often  Clean all touchable surfaces, such as counters, tabletops, doorknobs, bathroom fixtures, toilets, phones, keyboards, tablets, and bedside tables, every day. Also, clean any surfaces that may have blood, body fluids, and/or secretions or excretions on them.  Wear gloves when cleaning surfaces the patient has come in contact with.  Use a diluted bleach solution (e.g., dilute bleach with 1 part  bleach and 10 parts water) or a household disinfectant with a label that says EPA-registered for coronaviruses. To make a bleach solution at home, add 1 tablespoon of bleach to 1 quart (4 cups) of water. For a larger supply, add  cup of bleach to 1 gallon (16 cups) of water.  Read labels of cleaning products and follow recommendations provided on product labels. Labels contain instructions for safe and effective use of the cleaning product including precautions you should take when applying the product, such as wearing gloves or  eye protection and making sure you have good ventilation during use of the product.  Remove gloves and wash hands immediately after cleaning.  Monitor yourself for signs and symptoms of illness Caregivers and household members are considered close contacts, should monitor their health, and will be asked to limit movement outside of the home to the extent possible. Follow the monitoring steps for close contacts listed on the symptom monitoring form.   ? If you have additional questions, contact your local health department or call the epidemiologist on call at [phone] (available 24/7). ? This guidance is subject to change. For the most up-to-date guidance from CDC, please refer to their website: YouBlogs.pl   Diabetes Mellitus and Nutrition, Adult When you have diabetes (diabetes mellitus), it is very important to have healthy eating habits because your blood sugar (glucose) levels are greatly affected by what you eat and drink. Eating healthy foods in the appropriate amounts, at about the same times every day, can help you:  Control your blood glucose.  Lower your risk of heart disease.  Improve your blood pressure.  Reach or maintain a healthy weight. Every person with diabetes is different, and each person has different needs for a meal plan. Your health care provider may recommend that you work with a diet and nutrition specialist (dietitian) to make a meal plan that is best for you. Your meal plan may vary depending on factors such as:  The calories you need.  The medicines you take.  Your weight.  Your blood glucose, blood pressure, and cholesterol levels.  Your activity level.  Other health conditions you have, such as heart or kidney disease. How do carbohydrates affect me? Carbohydrates, also called carbs, affect your blood glucose level more than any other type of food. Eating carbs naturally raises  the amount of glucose in your blood. Carb counting is a method for keeping track of how many carbs you eat. Counting carbs is important to keep your blood glucose at a healthy level, especially if you use insulin or take certain oral diabetes medicines. It is important to know how many carbs you can safely have in each meal. This is different for every person. Your dietitian can help you calculate how many carbs you should have at each meal and for each snack. Foods that contain carbs include:  Bread, cereal, rice, pasta, and crackers.  Potatoes and corn.  Peas, beans, and lentils.  Milk and yogurt.  Fruit and juice.  Desserts, such as cakes, cookies, ice cream, and candy. How does alcohol affect me? Alcohol can cause a sudden decrease in blood glucose (hypoglycemia), especially if you use insulin or take certain oral diabetes medicines. Hypoglycemia can be a life-threatening condition. Symptoms of hypoglycemia (sleepiness, dizziness, and confusion) are similar to symptoms of having too much alcohol. If your health care provider says that alcohol is safe for you, follow these guidelines:  Limit alcohol intake to no more than 1 drink per day  for nonpregnant women and 2 drinks per day for men. One drink equals 12 oz of beer, 5 oz of wine, or 1 oz of hard liquor.  Do not drink on an empty stomach.  Keep yourself hydrated with water, diet soda, or unsweetened iced tea.  Keep in mind that regular soda, juice, and other mixers may contain a lot of sugar and must be counted as carbs. What are tips for following this plan?  Reading food labels  Start by checking the serving size on the "Nutrition Facts" label of packaged foods and drinks. The amount of calories, carbs, fats, and other nutrients listed on the label is based on one serving of the item. Many items contain more than one serving per package.  Check the total grams (g) of carbs in one serving. You can calculate the number of  servings of carbs in one serving by dividing the total carbs by 15. For example, if a food has 30 g of total carbs, it would be equal to 2 servings of carbs.  Check the number of grams (g) of saturated and trans fats in one serving. Choose foods that have low or no amount of these fats.  Check the number of milligrams (mg) of salt (sodium) in one serving. Most people should limit total sodium intake to less than 2,300 mg per day.  Always check the nutrition information of foods labeled as "low-fat" or "nonfat". These foods may be higher in added sugar or refined carbs and should be avoided.  Talk to your dietitian to identify your daily goals for nutrients listed on the label. Shopping  Avoid buying canned, premade, or processed foods. These foods tend to be high in fat, sodium, and added sugar.  Shop around the outside edge of the grocery store. This includes fresh fruits and vegetables, bulk grains, fresh meats, and fresh dairy. Cooking  Use low-heat cooking methods, such as baking, instead of high-heat cooking methods like deep frying.  Cook using healthy oils, such as olive, canola, or sunflower oil.  Avoid cooking with butter, cream, or high-fat meats. Meal planning  Eat meals and snacks regularly, preferably at the same times every day. Avoid going long periods of time without eating.  Eat foods high in fiber, such as fresh fruits, vegetables, beans, and whole grains. Talk to your dietitian about how many servings of carbs you can eat at each meal.  Eat 4-6 ounces (oz) of lean protein each day, such as lean meat, chicken, fish, eggs, or tofu. One oz of lean protein is equal to: ? 1 oz of meat, chicken, or fish. ? 1 egg. ?  cup of tofu.  Eat some foods each day that contain healthy fats, such as avocado, nuts, seeds, and fish. Lifestyle  Check your blood glucose regularly.  Exercise regularly as told by your health care provider. This may include: ? 150 minutes of  moderate-intensity or vigorous-intensity exercise each week. This could be brisk walking, biking, or water aerobics. ? Stretching and doing strength exercises, such as yoga or weightlifting, at least 2 times a week.  Take medicines as told by your health care provider.  Do not use any products that contain nicotine or tobacco, such as cigarettes and e-cigarettes. If you need help quitting, ask your health care provider.  Work with a Social worker or diabetes educator to identify strategies to manage stress and any emotional and social challenges. Questions to ask a health care provider  Do I need to meet with a  diabetes educator?  Do I need to meet with a dietitian?  What number can I call if I have questions?  When are the best times to check my blood glucose? Where to find more information:  American Diabetes Association: diabetes.org  Academy of Nutrition and Dietetics: www.eatright.[employer] of Diabetes and Digestive and Kidney Diseases (NIH): [employer] Summary  A healthy meal plan will help you control your blood glucose and maintain a healthy lifestyle.  Working with a diet and nutrition specialist (dietitian) can help you make a meal plan that is best for you.  Keep in mind that carbohydrates (carbs) and alcohol have immediate effects on your blood glucose levels. It is important to count carbs and to use alcohol carefully. This information is not intended to replace advice given to you by your health care provider. Make sure you discuss any questions you have with your health care provider. Document Revised: 12/04/2016 Document Reviewed: 01/27/2016 Elsevier Patient Education  2020 [employer].

## 2019-02-23 NOTE — Progress Notes (Signed)
Patient being discharged. This RN gathered patients belongings and made sure nothing was left in the room. Patient was wheeled down to his wife by this RN. This Rn informed his wife that he did receive his subq insulin and that he needs to eat.  Wife reports she will take him by and get him something to eat.  All VSS at discharge. Patient in good spirits and walked to the car with no assist.

## 2019-02-23 NOTE — Discharge Summary (Signed)
Physician Discharge Summary  NIXXON GILSTRAP [id number] DOB: [birth year] DOA: 02/20/2019  PCP: [name], MD  Admit date: 02/20/2019 Discharge date: 02/23/2019  Admitted From: Home  Disposition:  Home   Recommendations for Outpatient Follow-up:  1. Follow up with PCP in 1-2 weeks 2. Please obtain BMP/CBC in one week your next doctors visit.  3. Decadrone for 3 more days. PRN inhalers   Discharge Condition: Stable CODE STATUS: Full  Diet recommendation: Diabetic.   Brief/Interim Summary: 49 year old with history of DM2 presented to the hospital with feeling of worsening shortness of breath.  Was referred outpatient for antibiotic infusion by his PCP where he was noted to be hypoxic.  Upon admission he was started on remdesivir and steroids.  Also received 2 units of convalescent plasma.  Slowly was weaned off oxygen.  He did great with 4 days of remdesivir therefore will discontinue after today.  Due to bad weather outside, he was expelled not to skip tomorrow's dose.  Otherwise will given 3 more days of outpatient steroids.  Currently on room air saturating greater than 95%.  Acute hypoxic respiratory failure secondary to COVID-19 pneumonia -Now on room air saturating greater than 95%.  Completed 4 days of remdesivir, will skip the last dose since he is significantly improved, due to poor weather would not want him to drive to our infusion center for 1 more day.  Will prescribe him 3 more days of Decadron along with bronchodilators.  Diabetes mellitus type 2, uncontrolled secondary to steroids -Hemoglobin A1c 7.8.  Resume home medications  Hyperlipidemia -Statin  Discharge Diagnoses:  Active Problems:   Type 2 diabetes mellitus, uncontrolled (HCC)   Hyperlipidemia   Acute hypoxemic respiratory failure due to COVID-19 [facility])   Acute respiratory failure with hypoxia (HCC)    Consultations:  None  Subjective: Feels great, no complaints.  Wishes to go  home.  Discharge Exam: Vitals:   02/23/19 0750 02/23/19 0751  BP: 127/81   Pulse: 84 88  Resp: 16   Temp: 98.7 F (37.1 C)   SpO2: 96% 96%   Vitals:   02/22/19 1926 02/23/19 0358 02/23/19 0750 02/23/19 0751  BP: 132/87 135/83 127/81   Pulse: 83 78 84 88  Resp: 18 18 16    Temp: 98.2 F (36.8 C) 98.7 F (37.1 C) 98.7 F (37.1 C)   TempSrc: Oral Oral Oral   SpO2: 96% 94% 96% 96%  Weight:      Height:        General: Pt is alert, awake, not in acute distress Cardiovascular: RRR, S1/S2 +TA bilaterally, no wheezing, no rhonchi, no rubs, no gallops Respiratory: C Abdominal: Soft, NT, ND, bowel sounds + Extremities: no edema, no cyanosis  Discharge Instructions  Discharge Instructions    Diet - low sodium heart healthy   Complete by: As directed    Discharge instructions   Complete by: As directed    You were cared for by a hospitalist during your hospital stay. If you have any questions about your discharge medications or the care you received while you were in the hospital after you are discharged, you can call the unit and asked to speak with the hospitalist on call if the hospitalist that took care of you is not available. Once you are discharged, your primary care physician will handle any further medical issues. Please note that NO REFILLS for any discharge medications will be authorized once you are discharged, as it is imperative that you return to your primary  care physician (or establish a relationship with a primary care physician if you do not have one) for your aftercare needs so that they can reassess your need for medications and monitor your lab values.  Please request your Prim.MD to go over all Hospital Tests and Procedure/Radiological results at the follow up, please get all Hospital records sent to your Prim MD by signing hospital release before you go home.  Get CBC, CMP, 2 view Chest X ray checked  by Primary MD during your next visit or SNF MD in 5-7 days (  we routinely change or add medications that can affect your baseline labs and fluid status, therefore we recommend that you get the mentioned basic workup next visit with your PCP, your PCP may decide not to get them or add new tests based on their clinical decision)  On your next visit with your primary care physician please Get Medicines reviewed and adjusted.  If you experience worsening of your admission symptoms, develop shortness of breath, life threatening emergency, suicidal or homicidal thoughts you must seek medical attention immediately by calling 911 or calling your MD immediately  if symptoms less severe.  You Must read complete instructions/literature along with all the possible adverse reactions/side effects for all the Medicines you take and that have been prescribed to you. Take any new Medicines after you have completely understood and accpet all the possible adverse reactions/side effects.   Do not drive, operate heavy machinery, perform activities at heights, swimming or participation in water activities or provide baby sitting services if your were admitted for syncope or siezures until you have seen by Primary MD or a Neurologist and advised to do so again.  Do not drive when taking Pain medications.   Increase activity slowly   Complete by: As directed      Allergies as of 02/23/2019   No Known Allergies     Medication List    TAKE these medications   accu-chek soft touch lancets May check up 4 times daily E11.9   albuterol 108 (90 Base) MCG/ACT inhaler Commonly known as: VENTOLIN HFA Inhale 1-2 puffs into the lungs every 4 (four) hours as needed for wheezing or shortness of breath.   aspirin EC 81 MG tablet Take 81 mg by mouth daily.   dexamethasone 4 MG tablet Commonly known as: DECADRON Take 1 tablet (4 mg total) by mouth daily for 3 days.   Dulaglutide 0.75 MG/0.5ML Sopn Commonly known as: Trulicity INJECT [id number] MG INTO THE SKIN EVERY 7 (SEVEN)  DAYS. What changed:   how much to take  how to take this  when to take this  additional instructions   glimepiride 4 MG tablet Commonly known as: AMARYL TAKE ONE TABLET BY MOUTH ONE TIME DAILY WITH BREAKFAST What changed:   how much to take  how to take this  when to take this  additional instructions   glucose blood test strip One Touch Verio Flex ,  Test up to 4 times daily, Dx E11.9   metFORMIN 1000 MG tablet Commonly known as: GLUCOPHAGE Take 1 tablet (1,000 mg total) by mouth 2 (two) times daily with a meal.   simvastatin 40 MG tablet Commonly known as: ZOCOR TAKE 1 TABLET (40 MG TOTAL) BY MOUTH DAILY. What changed:   how much to take  how to take this  when to take this  additional instructions   VITAMIN D PO Take 1 capsule by mouth daily.      Follow-up Information  [name], MD. Schedule an appointment as soon as possible for a visit in 1 week(s).   Specialty: Family Medicine Contact information: [place] [address] [phone]          No Known Allergies  You were cared for by a hospitalist during your hospital stay. If you have any questions about your discharge medications or the care you received while you were in the hospital after you are discharged, you can call the unit and asked to speak with the hospitalist on call if the hospitalist that took care of you is not available. Once you are discharged, your primary care physician will handle any further medical issues. Please note that no refills for any discharge medications will be authorized once you are discharged, as it is imperative that you return to your primary care physician (or establish a relationship with a primary care physician if you do not have one) for your aftercare needs so that they can reassess your need for medications and monitor your lab values.   Procedures/Studies: Portable chest 1 View  Result Date: 02/20/2019 CLINICAL  DATA:  Acute hypoxic respiratory failure due to COVID 19. EXAM: PORTABLE CHEST 1 VIEW COMPARISON:  None. FINDINGS: The cardiac silhouette, mediastinal and hilar contours are within normal limits. Patchy ill-defined bilateral interstitial and airspace infiltrates consistent with COVID pneumonia. No pleural effusion or focal pulmonary lesions. The bony thorax is intact. IMPRESSION: Bilateral infiltrates consistent with COVID pneumonia. Electronically Signed   By: Marijo Sanes M.D.   On: 02/20/2019 14:13      The results of significant diagnostics from this hospitalization (including imaging, microbiology, ancillary and laboratory) are listed below for reference.     Microbiology: Recent Results (from the past 240 hour(s))  SARS CORONAVIRUS 2 (TAT 6-24 HRS) Nasopharyngeal Nasopharyngeal Swab     Status: Abnormal   Collection Time: 02/20/19  4:30 PM   Specimen: Nasopharyngeal Swab  Result Value Ref Range Status   SARS Coronavirus 2 POSITIVE (A) NEGATIVE Final    Comment: RESULT CALLED TO, READ BACK BY AND VERIFIED WITH: J. MARUEO,RN NO:[id number] 02/21/2019 T. TYSOR (NOTE) SARS-CoV-2 target nucleic acids are DETECTED. The SARS-CoV-2 RNA is generally detectable in upper and lower respiratory specimens during the acute phase of infection. Positive results are indicative of the presence of SARS-CoV-2 RNA. Clinical correlation with patient history and other diagnostic information is  necessary to determine patient infection status. Positive results do not rule out bacterial infection or co-infection with other viruses.  The expected result is Negative. Fact Sheet for Patients: SugarRoll.be Fact Sheet for Healthcare Providers: https://www.woods-mathews.com/ This test is not yet approved or cleared by the [place] FDA and  has been authorized for detection and/or diagnosis of SARS-CoV-2 by FDA under an Emergency Use Authorization (EUA). This EUA will remain   in effect (meaning this test can be used) for t he duration of the COVID-19 declaration under Section 564(b)(1) of the Act, 21 U.S.C. section 360bbb-3(b)(1), unless the authorization is terminated or revoked sooner. Performed at [facility], [place] [address] [place], [place] [address]      Labs: BNP (last 3 results) No results for input(s): BNP in the last 8760 hours. Basic Metabolic Panel: Recent Labs  Lab 02/20/19 1415 02/21/19 0700 02/22/19 0227 02/23/19 0228  NA 145 134* 139 139  K 3.0* 4.3 4.5 4.5  CL 101 94* 98 99  CO2 30 28 30 28   GLUCOSE 279* 187* 125* 87  BUN  28* 26* 32* 27*  CREATININE 1.53* 1.28* 1.08 1.10  CALCIUM 8.4* 8.8* 9.4 9.0   Liver Function Tests: Recent Labs  Lab 02/20/19 1415 02/21/19 0700 02/22/19 0227 02/23/19 0228  AST 57* 41 41 54*  ALT 60* 43 47* 65*  ALKPHOS 129* 44 43 50  BILITOT 0.8 0.7 0.5 0.5  PROT 7.4 7.5 7.6 7.5  ALBUMIN 3.6 3.1* 3.3* 3.2*   No results for input(s): LIPASE, AMYLASE in the last 168 hours. No results for input(s): AMMONIA in the last 168 hours. CBC: Recent Labs  Lab 02/21/19 0700 02/22/19 0227 02/23/19 0228  WBC 9.1 11.9* 14.1*  NEUTROABS 7.5 9.3* 11.3*  HGB 13.5 14.0 14.2  HCT 43.7 45.7 47.3  MCV 80.6 80.5 82.0  PLT 384 449* 529*   Cardiac Enzymes: No results for input(s): CKTOTAL, CKMB, CKMBINDEX, TROPONINI in the last 168 hours. BNP: Invalid input(s): POCBNP CBG: Recent Labs  Lab 02/21/19 1144 02/21/19 1638 02/21/19 2035 02/22/19 0738 02/22/19 1143  GLUCAP 218* 312* 266* 99 177*   D-Dimer Recent Labs    02/22/19 0227 02/23/19 0228  DDIMER 1.23* 0.61*   Hgb A1c Recent Labs    02/20/19 1415  HGBA1C 7.8*   Lipid Profile No results for input(s): CHOL, HDL, LDLCALC, TRIG, CHOLHDL, LDLDIRECT in the last 72 hours. Thyroid function studies No results for input(s): TSH, T4TOTAL, T3FREE, THYROIDAB in the last 72 hours.  Invalid input(s): FREET3 Anemia work up No results for  input(s): VITAMINB12, FOLATE, FERRITIN, TIBC, IRON, RETICCTPCT in the last 72 hours. Urinalysis    Component Value Date/Time   COLORURINE yellow 12/06/2008 1024   APPEARANCEUR Clear 12/06/2008 1024   LABSPEC 1.015 12/06/2008 1024   PHURINE 5.5 12/06/2008 1024   HGBUR negative 12/06/2008 1024   BILIRUBINUR negative 12/06/2008 1024   UROBILINOGEN 0.2 12/06/2008 1024   NITRITE negative 12/06/2008 1024   Sepsis Labs Invalid input(s): PROCALCITONIN,  WBC,  LACTICIDVEN Microbiology Recent Results (from the past 240 hour(s))  SARS CORONAVIRUS 2 (TAT 6-24 HRS) Nasopharyngeal Nasopharyngeal Swab     Status: Abnormal   Collection Time: 02/20/19  4:30 PM   Specimen: Nasopharyngeal Swab  Result Value Ref Range Status   SARS Coronavirus 2 POSITIVE (A) NEGATIVE Final    Comment: RESULT CALLED TO, READ BACK BY AND VERIFIED WITH: J. MARUEO,RN [id number] 02/21/2019 T. TYSOR (NOTE) SARS-CoV-2 target nucleic acids are DETECTED. The SARS-CoV-2 RNA is generally detectable in upper and lower respiratory specimens during the acute phase of infection. Positive results are indicative of the presence of SARS-CoV-2 RNA. Clinical correlation with patient history and other diagnostic information is  necessary to determine patient infection status. Positive results do not rule out bacterial infection or co-infection with other viruses.  The expected result is Negative. Fact Sheet for Patients: SugarRoll.be Fact Sheet for Healthcare Providers: https://www.woods-mathews.com/ This test is not yet approved or cleared by the [place] FDA and  has been authorized for detection and/or diagnosis of SARS-CoV-2 by FDA under an Emergency Use Authorization (EUA). This EUA will remain  in effect (meaning this test can be used) for t he duration of the COVID-19 declaration under Section 564(b)(1) of the Act, 21 U.S.C. section 360bbb-3(b)(1), unless the authorization is terminated  or revoked sooner. Performed at [facility], [place] [address] [place], [place] [address]      Time coordinating discharge:  I have spent 35 minutes face to face with the patient and on the ward discussing the patients care, assessment, plan and disposition  with other care givers. >50% of the time was devoted counseling the patient about the risks and benefits of treatment/Discharge disposition and coordinating care.   SIGNED:   Damita Lack, MD  [facility] 02/23/2019, 9:47 AM   If 7PM-7AM, please contact night-coverage

## 2019-02-23 NOTE — Progress Notes (Signed)
Patient awaiting ride for discharge. Patient did want to receive his insulin before departure. This RN administered 17 units novolog.  Crackers and peanut butter were given as a snack and patient reports he will be grabbing lunch on his ride home today.

## 2019-02-23 NOTE — Plan of Care (Signed)
Progressing. Discharge today.

## 2019-02-24 ENCOUNTER — Telehealth: Payer: Self-pay | Admitting: Family Medicine

## 2019-02-24 ENCOUNTER — Other Ambulatory Visit: Payer: Self-pay

## 2019-02-24 MED ORDER — ONETOUCH DELICA LANCETS 33G MISC: 3 refills | Status: AC

## 2019-02-24 MED ORDER — ONETOUCH VERIO VI STRP: ORAL_STRIP | 3 refills | Status: DC

## 2019-02-24 MED ORDER — ONETOUCH VERIO FLEX SYSTEM W/DEVICE KIT: PACK | 1 refills | Status: AC

## 2019-02-24 NOTE — Telephone Encounter (Signed)
How many times per day do you want this patient to test?

## 2019-02-24 NOTE — Telephone Encounter (Signed)
I have sent Onetouch Verio Flex, test strips, and lancets to requested pharmacy for patient.

## 2019-02-24 NOTE — Telephone Encounter (Signed)
two

## 2019-02-24 NOTE — Telephone Encounter (Signed)
Pt is requesting a blood glucose monitor sent to [facility] on [address]. Thanks

## 2019-03-03 ENCOUNTER — Encounter: Payer: Self-pay | Admitting: Family Medicine

## 2019-03-03 ENCOUNTER — Telehealth: Payer: Self-pay | Admitting: Family Medicine

## 2019-03-03 ENCOUNTER — Ambulatory Visit (INDEPENDENT_AMBULATORY_CARE_PROVIDER_SITE_OTHER): Payer: 59

## 2019-03-03 ENCOUNTER — Other Ambulatory Visit: Payer: Self-pay

## 2019-03-03 ENCOUNTER — Ambulatory Visit (INDEPENDENT_AMBULATORY_CARE_PROVIDER_SITE_OTHER): Payer: 59 | Admitting: Family Medicine

## 2019-03-03 VITALS — BP 110/80 | HR 94 | Temp 97.3°F | Wt 201.2 lb

## 2019-03-03 DIAGNOSIS — E1165 Type 2 diabetes mellitus with hyperglycemia: Secondary | ICD-10-CM

## 2019-03-03 DIAGNOSIS — J1282 Pneumonia due to coronavirus disease 2019: Secondary | ICD-10-CM | POA: Diagnosis not present

## 2019-03-03 DIAGNOSIS — U071 COVID-19: Secondary | ICD-10-CM

## 2019-03-03 LAB — CBC WITH DIFFERENTIAL/PLATELET
Basophils Absolute: 0 10*3/uL (ref 0.0–0.1)
Basophils Relative: 0.4 % (ref 0.0–3.0)
Eosinophils Absolute: 0.1 10*3/uL (ref 0.0–0.7)
Eosinophils Relative: 1.4 % (ref 0.0–5.0)
HCT: 41.2 % (ref 39.0–52.0)
Hemoglobin: 13.4 g/dL (ref 13.0–17.0)
Lymphocytes Relative: 21.7 % (ref 12.0–46.0)
Lymphs Abs: 1.4 10*3/uL (ref 0.7–4.0)
MCHC: 32.4 g/dL (ref 30.0–36.0)
MCV: 79 fl (ref 78.0–100.0)
Monocytes Absolute: 0.8 10*3/uL (ref 0.1–1.0)
Monocytes Relative: 11.9 % (ref 3.0–12.0)
Neutro Abs: 4.3 10*3/uL (ref 1.4–7.7)
Neutrophils Relative %: 64.6 % (ref 43.0–77.0)
Platelets: 523 10*3/uL — ABNORMAL HIGH (ref 150.0–400.0)
RBC: 5.22 Mil/uL (ref 4.22–5.81)
RDW: 15.1 % (ref 11.5–15.5)
WBC: 6.7 10*3/uL (ref 4.0–10.5)

## 2019-03-03 LAB — BASIC METABOLIC PANEL
BUN: 18 mg/dL (ref 6–23)
CO2: 30 mEq/L (ref 19–32)
Calcium: 9.4 mg/dL (ref 8.4–10.5)
Chloride: 103 mEq/L (ref 96–112)
Creatinine, Ser: 1.12 mg/dL (ref 0.40–1.50)
GFR: 83.77 mL/min (ref >60.00)
Glucose, Bld: 143 mg/dL — ABNORMAL HIGH (ref 70–99)
Potassium: 4.7 mEq/L (ref 3.5–5.1)
Sodium: 140 mEq/L (ref 135–145)

## 2019-03-03 NOTE — Telephone Encounter (Signed)
Please advise 

## 2019-03-03 NOTE — Telephone Encounter (Signed)
Pt is requesting an excuse note for work. Pt will be going back to work on 03/13/19.Pt was seen earlier today and was given an excuse note that was dated for 03/06/19 but pt is requesting the return date to work on 03/13/19. Thanks

## 2019-03-03 NOTE — Progress Notes (Signed)
Subjective:     Patient ID: Todd English, male   DOB: 1970/04/11, 49 y.o.   MRN: [id number]  HPI  Todd English is seen for hospital follow-up.  We had engaged in virtual visit back several weeks ago and he had Covid infection.  He was having progressive symptoms and we initially referred him for consideration for monoclonal antibody infusion.  He went to be evaluated for infusion but his oxygen levels 84%.  At that point he was referred for admission.  He was treated with remdesivir and steroids.  He also received 2 units of convalescent plasma.  He was gradually weaned off oxygen.  He was treated with 3 more days of outpatient steroids.  He does have type 2 diabetes and has not been monitoring his sugars regularly.  He was diagnosed with acute hypoxic respiratory failure secondary to COVID-19 pneumonia.  His chest x-ray showed bilateral infiltrates.  His A1c was 7.8%.  He is only rarely been using his bronchodilator.  He has not yet started back to work.  He is trying to progress his activities.  He has had decreased appetite and lost 18 pounds but his appetite is improving.  He never lost taste or smell.  Past Medical History:  Diagnosis Date  . Allergy   . Depression    Patient states he does not have depression.  . Diabetes mellitus   . GERD (gastroesophageal reflux disease)    Patient states he does not have GERD   No past surgical history on file.  reports that he quit smoking about 9 years ago. His smoking use included cigarettes. He has a 15.00 pack-year smoking history. He has never used smokeless tobacco. He reports current alcohol use of about 2.0 standard drinks of alcohol per week. He reports that he does not use drugs. family history includes Diabetes Mellitus II in his maternal grandmother. No Known Allergies   Review of Systems  Constitutional: Positive for fatigue. Negative for chills and fever.  Respiratory: Negative for cough and wheezing.   Cardiovascular: Negative for  chest pain, palpitations and leg swelling.  Gastrointestinal: Negative for abdominal pain.  Genitourinary: Negative for dysuria.  Neurological: Negative for dizziness and headaches.       Objective:   Physical Exam Constitutional:      Appearance: He is well-developed.  HENT:     Right Ear: External ear normal.     Left Ear: External ear normal.  Eyes:     Pupils: Pupils are equal, round, and reactive to light.  Neck:     Thyroid: No thyromegaly.  Cardiovascular:     Rate and Rhythm: Normal rate and regular rhythm.  Pulmonary:     Effort: Pulmonary effort is normal. No respiratory distress.     Breath sounds: Normal breath sounds. No wheezing or rales.     Comments: Pulse oximetry 95% Musculoskeletal:     Cervical back: Neck supple.  Neurological:     Mental Status: He is alert and oriented to person, place, and time.        Assessment:     #1 recent COVID-19 infection with bilateral pneumonia.  He had acute hypoxic respiratory failure which improved promptly with convalescent plasma, remdesivir, and Decadron.  He continues to gradually improve  #2 type 2 diabetes.  Suboptimal control but hopefully will be improving with his recent weight loss    Plan:     -Recheck CBC and comprehensive metabolic panel -Repeat 2 view chest x-ray -He will try to return  to work with target date of March 1 -We recommended 91-month follow-up and recheck A1c then -He is strongly advised to try to gradually increase his exercise regimen  [name] MD [facility]

## 2019-03-03 NOTE — Telephone Encounter (Signed)
OK 

## 2019-03-03 NOTE — Telephone Encounter (Signed)
Letter completed.

## 2019-03-10 ENCOUNTER — Encounter: Payer: 59 | Admitting: Gastroenterology

## 2019-04-28 ENCOUNTER — Ambulatory Visit: Payer: 59 | Admitting: Family Medicine

## 2019-05-16 LAB — HM DIABETES EYE EXAM

## 2019-05-20 ENCOUNTER — Other Ambulatory Visit: Payer: Self-pay | Admitting: Family Medicine

## 2019-05-30 ENCOUNTER — Other Ambulatory Visit: Payer: Self-pay

## 2019-05-31 ENCOUNTER — Ambulatory Visit (INDEPENDENT_AMBULATORY_CARE_PROVIDER_SITE_OTHER): Payer: 59 | Admitting: Family Medicine

## 2019-05-31 ENCOUNTER — Encounter: Payer: Self-pay | Admitting: Family Medicine

## 2019-05-31 VITALS — BP 120/62 | HR 100 | Temp 98.1°F | Wt 213.0 lb

## 2019-05-31 DIAGNOSIS — E1165 Type 2 diabetes mellitus with hyperglycemia: Secondary | ICD-10-CM | POA: Diagnosis not present

## 2019-05-31 LAB — POCT GLYCOSYLATED HEMOGLOBIN (HGB A1C): Hemoglobin A1C: 6.2 % — AB (ref 4.0–5.6)

## 2019-05-31 MED ORDER — GLIMEPIRIDE 2 MG PO TABS: 2.0000 mg | ORAL_TABLET | Freq: Every day | ORAL | 3 refills | Status: DC

## 2019-05-31 NOTE — Patient Instructions (Signed)
Let's try reducing the Amaryl to 2 mg and I sent in new prescription for that  Keep up the good work!  A1C 6.2 (was 7.8).

## 2019-05-31 NOTE — Progress Notes (Signed)
  Subjective:     Patient ID: Todd English, male   DOB: 09-04-1970, 49 y.o.   MRN: [id number]  HPI Todd English is here for follow-up diabetes.  He had Covid infection back in February and feels fully recovered at this time.  His last A1c was 7.8%.  He has made some very positive dietary changes and is greatly restricted carbohydrates.  He feels his A1c will be improved today.  He remains on Metformin, glimepiride, and Trulicity.  He ran out of glimepiride about a week ago.  No recent hypoglycemia  Past Medical History:  Diagnosis Date  . Allergy   . Depression    Patient states he does not have depression.  . Diabetes mellitus   . GERD (gastroesophageal reflux disease)    Patient states he does not have GERD   No past surgical history on file.  reports that he quit smoking about 9 years ago. His smoking use included cigarettes. He has a 15.00 pack-year smoking history. He has never used smokeless tobacco. He reports current alcohol use of about 2.0 standard drinks of alcohol per week. He reports that he does not use drugs. family history includes Diabetes Mellitus II in his maternal grandmother. No Known Allergies   Review of Systems  Constitutional: Negative for fatigue.  Eyes: Negative for visual disturbance.  Respiratory: Negative for cough, chest tightness and shortness of breath.   Cardiovascular: Negative for chest pain, palpitations and leg swelling.  Endocrine: Negative for polydipsia and polyuria.  Neurological: Negative for dizziness, syncope, weakness, light-headedness and headaches.       Objective:   Physical Exam Constitutional:      Appearance: He is well-developed.  HENT:     Right Ear: External ear normal.     Left Ear: External ear normal.  Eyes:     Pupils: Pupils are equal, round, and reactive to light.  Neck:     Thyroid: No thyromegaly.  Cardiovascular:     Rate and Rhythm: Normal rate and regular rhythm.  Pulmonary:     Effort: Pulmonary effort is  normal. No respiratory distress.     Breath sounds: Normal breath sounds. No wheezing or rales.  Musculoskeletal:     Cervical back: Neck supple.  Neurological:     Mental Status: He is alert and oriented to person, place, and time.        Assessment:     Type 2 diabetes greatly improved with A1c today 6.2%    Plan:     -Try reducing glimepiride to 2 mg with refills given -Establish more consistent exercise -Recommend 74-month follow-up.  If A1c still doing well at that time may consider discontinuing glimepiride.  We will also need other labs at that point including lipids and chemistries.  [name] MD [facility]

## 2019-06-06 ENCOUNTER — Encounter: Payer: Self-pay | Admitting: Family Medicine

## 2019-06-06 DIAGNOSIS — E11319 Type 2 diabetes mellitus with unspecified diabetic retinopathy without macular edema: Secondary | ICD-10-CM | POA: Insufficient documentation

## 2019-06-08 ENCOUNTER — Other Ambulatory Visit: Payer: Self-pay | Admitting: Family Medicine

## 2019-06-09 ENCOUNTER — Other Ambulatory Visit: Payer: Self-pay

## 2019-06-09 ENCOUNTER — Encounter: Payer: Self-pay | Admitting: Gastroenterology

## 2019-06-09 ENCOUNTER — Ambulatory Visit (AMBULATORY_SURGERY_CENTER): Payer: Self-pay

## 2019-06-09 VITALS — Ht 70.5 in | Wt 212.2 lb

## 2019-06-09 DIAGNOSIS — Z1211 Encounter for screening for malignant neoplasm of colon: Secondary | ICD-10-CM

## 2019-06-09 NOTE — Progress Notes (Signed)
No allergies to soy or egg Pt is not on blood thinners or diet pills Unable to assess issues with sedation/intubation- no surgical hx Denies atrial flutter/fib Denies constipation   Emmi instructions given to pt  Pt is aware of Covid safety and care partner requirements.

## 2019-06-20 ENCOUNTER — Other Ambulatory Visit: Payer: Self-pay | Admitting: Gastroenterology

## 2019-06-20 ENCOUNTER — Ambulatory Visit (INDEPENDENT_AMBULATORY_CARE_PROVIDER_SITE_OTHER): Payer: 59

## 2019-06-20 DIAGNOSIS — Z1159 Encounter for screening for other viral diseases: Secondary | ICD-10-CM

## 2019-06-20 LAB — SARS CORONAVIRUS 2 (TAT 6-24 HRS): SARS Coronavirus 2: NEGATIVE

## 2019-06-23 ENCOUNTER — Encounter: Payer: Self-pay | Admitting: Gastroenterology

## 2019-06-23 ENCOUNTER — Ambulatory Visit (AMBULATORY_SURGERY_CENTER): Payer: 59 | Admitting: Gastroenterology

## 2019-06-23 ENCOUNTER — Other Ambulatory Visit: Payer: Self-pay

## 2019-06-23 VITALS — BP 142/91 | HR 74 | Temp 84.0°F | Resp 14 | Ht 70.0 in | Wt 212.0 lb

## 2019-06-23 DIAGNOSIS — Z1211 Encounter for screening for malignant neoplasm of colon: Secondary | ICD-10-CM | POA: Diagnosis not present

## 2019-06-23 DIAGNOSIS — K621 Rectal polyp: Secondary | ICD-10-CM | POA: Diagnosis not present

## 2019-06-23 DIAGNOSIS — K635 Polyp of colon: Secondary | ICD-10-CM

## 2019-06-23 DIAGNOSIS — D129 Benign neoplasm of anus and anal canal: Secondary | ICD-10-CM

## 2019-06-23 DIAGNOSIS — D125 Benign neoplasm of sigmoid colon: Secondary | ICD-10-CM

## 2019-06-23 DIAGNOSIS — D127 Benign neoplasm of rectosigmoid junction: Secondary | ICD-10-CM

## 2019-06-23 MED ORDER — SODIUM CHLORIDE 0.9 % IV SOLN: 500.0000 mL | INTRAVENOUS | Status: DC

## 2019-06-23 NOTE — Progress Notes (Signed)
Vs cw I have reviewed the patient's medical history in detail and updated the computerized patient record. 

## 2019-06-23 NOTE — Op Note (Signed)
[place] Patient Name: Todd English Procedure Date: 06/23/2019 8:32 AM MRN: [id number] Endoscopist: Justice Britain , MD Age: 49 Referring MD:  Date of Birth: [birth year] Gender: Male Account #: [id number] Procedure:                Colonoscopy Indications:              Screening for colorectal malignant neoplasm, This                            is the patient's first colonoscopy Medicines:                Monitored Anesthesia Care Procedure:                Pre-Anesthesia Assessment:                           - Prior to the procedure, a History and Physical                            was performed, and patient medications and                            allergies were reviewed. The patient's tolerance of                            previous anesthesia was also reviewed. The risks                            and benefits of the procedure and the sedation                            options and risks were discussed with the patient.                            All questions were answered, and informed consent                            was obtained. Prior Anticoagulants: The patient has                            taken no previous anticoagulant or antiplatelet                            agents except for aspirin. ASA Grade Assessment: II                            - A patient with mild systemic disease. After                            reviewing the risks and benefits, the patient was                            deemed in satisfactory condition to undergo the  procedure.                           After obtaining informed consent, the colonoscope                            was passed under direct vision. Throughout the                            procedure, the patient's blood pressure, pulse, and                            oxygen saturations were monitored continuously. The                            Colonoscope was introduced through the anus and                             advanced to the the cecum, identified by                            appendiceal orifice and ileocecal valve. The                            colonoscopy was somewhat difficult due to a                            tortuous colon. Successful completion of the                            procedure was aided by changing the patient's                            position, using manual pressure, straightening and                            shortening the scope to obtain bowel loop reduction                            and using scope torsion. The quality of the bowel                            preparation was adequate. The ileocecal valve,                            appendiceal orifice, and rectum were photographed. Scope In: 8:47:50 AM Scope Out: 9:08:07 AM Scope Withdrawal Time: 0 hours 13 minutes 14 seconds  Total Procedure Duration: 0 hours 20 minutes 17 seconds  Findings:                 The digital rectal exam findings include                            hemorrhoids. Pertinent negatives include no  palpable rectal lesions.                           Three sessile polyps were found in the rectum (2)                            and sigmoid colon (1). The polyps were 2 to 4 mm in                            size. These polyps were removed with a cold snare.                            Resection and retrieval were complete.                           A few small-mouthed diverticula were found in the                            recto-sigmoid colon and sigmoid colon.                           Normal mucosa was found in the entire colon                            otherwise.                           Non-bleeding non-thrombosed internal hemorrhoids                            were found during retroflexion, during perianal                            exam and during digital exam. The hemorrhoids were                            Grade II (internal hemorrhoids that  prolapse but                            reduce spontaneously). Complications:            No immediate complications. Estimated Blood Loss:     Estimated blood loss was minimal. Impression:               - Hemorrhoids found on digital rectal exam.                           - Three 2 to 4 mm polyps in the rectum and in the                            sigmoid colon, removed with a cold snare. Resected                            and retrieved.                           -  Diverticulosis in the recto-sigmoid colon and in                            the sigmoid colon.                           - Normal mucosa in the entire examined colon                            otherwise.                           - Non-bleeding non-thrombosed internal hemorrhoids. Recommendation:           - The patient will be observed post-procedure,                            until all discharge criteria are met.                           - Discharge patient to home.                           - Patient has a contact number available for                            emergencies. The signs and symptoms of potential                            delayed complications were discussed with the                            patient. Return to normal activities tomorrow.                            Written discharge instructions were provided to the                            patient.                           - High fiber diet.                           - Use FiberCon 1-2 tablets PO daily.                           - Continue present medications.                           - Await pathology results.                           - Repeat colonoscopy 3/05/11/08 years for                            surveillance based on pathology results.                           -  The findings and recommendations were discussed                            with the patient. Justice Britain, MD 06/23/2019 9:13:15 AM

## 2019-06-23 NOTE — Patient Instructions (Signed)
Handouts Provided: Polyps, Diverticulosis, and Hemorrhoids  High Fiber Diet is recommended for you.  Also use over the counter FiberCon 1-2 tablets Daily.   YOU HAD AN ENDOSCOPIC PROCEDURE TODAY AT [facility]:   Refer to the procedure report that was given to you for any specific questions about what was found during the examination.  If the procedure report does not answer your questions, please call your gastroenterologist to clarify.  If you requested that your care partner not be given the details of your procedure findings, then the procedure report has been included in a sealed envelope for you to review at your convenience later.  YOU SHOULD EXPECT: Some feelings of bloating in the abdomen. Passage of more gas than usual.  Walking can help get rid of the air that was put into your GI tract during the procedure and reduce the bloating. If you had a lower endoscopy (such as a colonoscopy or flexible sigmoidoscopy) you may notice spotting of blood in your stool or on the toilet paper. If you underwent a bowel prep for your procedure, you may not have a normal bowel movement for a few days.  Please Note:  You might notice some irritation and congestion in your nose or some drainage.  This is from the oxygen used during your procedure.  There is no need for concern and it should clear up in a day or so.  SYMPTOMS TO REPORT IMMEDIATELY:   Following lower endoscopy (colonoscopy or flexible sigmoidoscopy):  Excessive amounts of blood in the stool  Significant tenderness or worsening of abdominal pains  Swelling of the abdomen that is new, acute  Fever of 100F or higher  For urgent or emergent issues, a gastroenterologist can be reached at any hour by calling [phone]. Do not use MyChart messaging for urgent concerns.    DIET:  We do recommend a small meal at first, but then you may proceed to your regular diet.  Drink plenty of fluids but you should avoid alcoholic  beverages for 24 hours.  ACTIVITY:  You should plan to take it easy for the rest of today and you should NOT DRIVE or use heavy machinery until tomorrow (because of the sedation medicines used during the test).    FOLLOW UP: Our staff will call the number listed on your records 48-72 hours following your procedure to check on you and address any questions or concerns that you may have regarding the information given to you following your procedure. If we do not reach you, we will leave a message.  We will attempt to reach you two times.  During this call, we will ask if you have developed any symptoms of COVID 19. If you develop any symptoms (ie: fever, flu-like symptoms, shortness of breath, cough etc.) before then, please call [phone].  If you test positive for Covid 19 in the 2 weeks post procedure, please call and report this information to [place].    If any biopsies were taken you will be contacted by phone or by letter within the next 1-3 weeks.  Please call us at [phone] if you have not heard about the biopsies in 3 weeks.    SIGNATURES/CONFIDENTIALITY: You and/or your care partner have signed paperwork which will be entered into your electronic medical record.  These signatures attest to the fact that that the information above on your After Visit Summary has been reviewed and is understood.  Full responsibility of the confidentiality of this discharge  information lies with you and/or your care-partner.

## 2019-06-23 NOTE — Progress Notes (Signed)
Called to room to assist during endoscopic procedure.  Patient ID and intended procedure confirmed with present staff. Received instructions for my participation in the procedure from the performing physician.  

## 2019-06-23 NOTE — Progress Notes (Signed)
A/ox3, pleased with MAC, report to RN 

## 2019-06-27 ENCOUNTER — Encounter: Payer: Self-pay | Admitting: Gastroenterology

## 2019-06-27 ENCOUNTER — Telehealth: Payer: Self-pay

## 2019-06-27 NOTE — Telephone Encounter (Signed)
2nd follow up call made.  NAULM 

## 2019-06-27 NOTE — Telephone Encounter (Signed)
1st follow up call made.  NAULM 

## 2019-10-02 ENCOUNTER — Ambulatory Visit: Payer: 59 | Admitting: Family Medicine

## 2019-10-06 ENCOUNTER — Ambulatory Visit (INDEPENDENT_AMBULATORY_CARE_PROVIDER_SITE_OTHER): Payer: 59 | Admitting: Family Medicine

## 2019-10-06 ENCOUNTER — Other Ambulatory Visit: Payer: Self-pay

## 2019-10-06 ENCOUNTER — Encounter: Payer: Self-pay | Admitting: Family Medicine

## 2019-10-06 VITALS — BP 120/74 | HR 84 | Temp 98.1°F | Ht 72.0 in | Wt 204.5 lb

## 2019-10-06 DIAGNOSIS — E1165 Type 2 diabetes mellitus with hyperglycemia: Secondary | ICD-10-CM | POA: Diagnosis not present

## 2019-10-06 DIAGNOSIS — E785 Hyperlipidemia, unspecified: Secondary | ICD-10-CM | POA: Diagnosis not present

## 2019-10-06 LAB — POCT GLYCOSYLATED HEMOGLOBIN (HGB A1C): Hemoglobin A1C: 5.8 % — AB (ref 4.0–5.6)

## 2019-10-06 NOTE — Patient Instructions (Signed)
Stop the Glimepiride and let's plan to repeat A1C in about 3 months.

## 2019-10-06 NOTE — Progress Notes (Signed)
Established Patient Office Visit  Subjective:  Patient ID: Todd English, male    DOB: Sep 15, 1970  Age: 49 y.o. MRN: [id number]  CC:  Chief Complaint  Patient presents with  . Diabetes    Doing okay    HPI Todd English presents for follow-up regarding type 2 diabetes and hyperlipidemia.  His current medications include Trulicity, simvastatin, Metformin, glimepiride.  He is done excellent job with weight loss and has been fairly active.  His last A1c was 6.2%.  No recent hypoglycemic symptoms.  He is overdue for lipids.  He takes simvastatin and has no myalgias.  Compliant with medications.  Not monitoring blood sugars regularly.  Does need to set up eye exam.  No recent urine microalbumin screen.  He had Covid infection last winter and had monoclonal antibody infusion.  has done well since then.  He is declined Covid vaccination but is considering.  He also declines flu vaccine  Past Medical History:  Diagnosis Date  . Allergy   . Depression    Patient states he does not have depression.  . Diabetes mellitus   . GERD (gastroesophageal reflux disease)    Patient states he does not have GERD  . Hyperlipidemia     Past Surgical History:  Procedure Laterality Date  . no surgical hx      Family History  Problem Relation Age of Onset  . Diabetes Mellitus II Maternal Grandmother   . Colon cancer Neg Hx   . Colon polyps Neg Hx   . Esophageal cancer Neg Hx   . Rectal cancer Neg Hx   . Stomach cancer Neg Hx     Social History   Socioeconomic History  . Marital status: Married    Spouse name: Not on file  . Number of children: Not on file  . Years of education: Not on file  . Highest education level: Not on file  Occupational History  . Not on file  Tobacco Use  . Smoking status: Former Smoker    Packs/day: 1.00    Years: 15.00    Pack years: 15.00    Types: Cigarettes    Quit date: 12/26/2009    Years since quitting: 9.7  . Smokeless tobacco: Never Used   Vaping Use  . Vaping Use: Never used  Substance and Sexual Activity  . Alcohol use: Yes    Alcohol/week: 2.0 standard drinks    Types: 2 Cans of beer per week  . Drug use: Never  . Sexual activity: Yes  Other Topics Concern  . Not on file  Social History Narrative  . Not on file   Social Determinants of Health   Financial Resource Strain:   . Difficulty of Paying Living Expenses: Not on file  Food Insecurity:   . Worried About Charity fundraiser in the Last Year: Not on file  . Ran Out of Food in the Last Year: Not on file  Transportation Needs:   . Lack of Transportation (Medical): Not on file  . Lack of Transportation (Non-Medical): Not on file  Physical Activity:   . Days of Exercise per Week: Not on file  . Minutes of Exercise per Session: Not on file  Stress:   . Feeling of Stress : Not on file  Social Connections:   . Frequency of Communication with Friends and Family: Not on file  . Frequency of Social Gatherings with Friends and Family: Not on file  . Attends Religious Services: Not on file  .  Active Member of Clubs or Organizations: Not on file  . Attends Archivist Meetings: Not on file  . Marital Status: Not on file  Intimate Partner Violence:   . Fear of Current or Ex-Partner: Not on file  . Emotionally Abused: Not on file  . Physically Abused: Not on file  . Sexually Abused: Not on file    Outpatient Medications Prior to Visit  Medication Sig Dispense Refill  . aspirin EC 81 MG tablet Take 81 mg by mouth daily.    . Blood Glucose Monitoring Suppl (ONETOUCH VERIO FLEX SYSTEM) w/Device KIT Use to check blood glucose two times daily. Dx Code E11.65 1 kit 1  . glucose blood (ONETOUCH VERIO) test strip Use as instructed to check blood glucose two times daily. Dx Code E11.65 200 each 3  . metFORMIN (GLUCOPHAGE) 1000 MG tablet TAKE 1 TABLET (1,000 MG TOTAL) BY MOUTH 2 (TWO) TIMES DAILY WITH A MEAL. 180 tablet 3  . OneTouch Delica Lancets 30Q MISC Use  to check blood glucose two times daily. Dx Code E11.65 200 each 3  . simvastatin (ZOCOR) 40 MG tablet TAKE 1 TABLET BY MOUTH EVERY DAY 90 tablet 3  . TRULICITY 7.62 UQ/3.3HL SOPN INJECT 0.75 MG INTO THE SKIN EVERY 7 (SEVEN) DAYS. 3 pen 12  . VITAMIN D PO Take 1 capsule by mouth daily.    Marland Kitchen glimepiride (AMARYL) 2 MG tablet Take 1 tablet (2 mg total) by mouth daily before breakfast. 90 tablet 3   No facility-administered medications prior to visit.    No Known Allergies  ROS Review of Systems  Constitutional: Negative for fatigue.  Eyes: Negative for visual disturbance.  Respiratory: Negative for cough, chest tightness and shortness of breath.   Cardiovascular: Negative for chest pain, palpitations and leg swelling.  Endocrine: Negative for polydipsia and polyuria.  Neurological: Negative for dizziness, syncope, weakness, light-headedness and headaches.      Objective:    Physical Exam Constitutional:      Appearance: He is well-developed.  HENT:     Right Ear: External ear normal.     Left Ear: External ear normal.  Eyes:     Pupils: Pupils are equal, round, and reactive to light.  Neck:     Thyroid: No thyromegaly.  Cardiovascular:     Rate and Rhythm: Normal rate and regular rhythm.  Pulmonary:     Effort: Pulmonary effort is normal. No respiratory distress.     Breath sounds: Normal breath sounds. No wheezing or rales.  Musculoskeletal:     Cervical back: Neck supple.  Skin:    Comments: Feet reveal no skin lesions. Good distal foot pulses. Good capillary refill. No calluses. Normal sensation with monofilament testing   Neurological:     Mental Status: He is alert and oriented to person, place, and time.     BP 120/74   Pulse 84   Temp 98.1 F (36.7 C) (Oral)   Ht 6' (1.829 m)   Wt 204 lb 8 oz (92.8 kg)   SpO2 99%   BMI 27.74 kg/m  Wt Readings from Last 3 Encounters:  10/06/19 204 lb 8 oz (92.8 kg)  06/23/19 212 lb (96.2 kg)  06/09/19 212 lb 3.2 oz (96.3  kg)     Health Maintenance Due  Topic Date Due  . Hepatitis C Screening  Never done  . HIV Screening  Never done  . URINE MICROALBUMIN  07/27/2019    There are no preventive care reminders to  display for this patient.  Lab Results  Component Value Date   TSH 3.29 07/27/2018   Lab Results  Component Value Date   WBC 6.7 03/03/2019   HGB 13.4 03/03/2019   HCT 41.2 03/03/2019   MCV 79.0 03/03/2019   PLT 523.0 (H) 03/03/2019   Lab Results  Component Value Date   NA 140 03/03/2019   K 4.7 03/03/2019   CO2 30 03/03/2019   GLUCOSE 143 (H) 03/03/2019   BUN 18 03/03/2019   CREATININE 1.12 03/03/2019   BILITOT 0.5 02/23/2019   ALKPHOS 50 02/23/2019   AST 54 (H) 02/23/2019   ALT 65 (H) 02/23/2019   PROT 7.5 02/23/2019   ALBUMIN 3.2 (L) 02/23/2019   CALCIUM 9.4 03/03/2019   ANIONGAP 12 02/23/2019   GFR 83.77 03/03/2019   Lab Results  Component Value Date   CHOL 189 07/27/2018   Lab Results  Component Value Date   HDL 50.50 07/27/2018   Lab Results  Component Value Date   LDLCALC 110 (H) 07/27/2018   Lab Results  Component Value Date   TRIG 144.0 07/27/2018   Lab Results  Component Value Date   CHOLHDL 4 07/27/2018   Lab Results  Component Value Date   HGBA1C 5.8 (A) 10/06/2019      Assessment & Plan:   Problem List Items Addressed This Visit      Unprioritized   Type 2 diabetes mellitus, uncontrolled ([place]) - Primary   Relevant Orders   POCT glycosylated hemoglobin (Hb A1C) (Completed)   Basic metabolic panel   Microalbumin / creatinine urine ratio   Hyperlipidemia   Relevant Orders   Lipid panel   Hepatic function panel    -Check further labs with lipid, urine microalbumin, hepatic panel, basic metabolic panel  -We recommend he stop the glimepiride at this point with A1c today of 5.8%.  He was just on low-dose of 2 mg daily.  -We have recommended he consider flu vaccine but he declines  -Consider follow-up for physical at some point later  this year  No orders of the defined types were placed in this encounter.   Follow-up: No follow-ups on file.    Carolann Littler, MD

## 2019-10-07 LAB — LIPID PANEL
Cholesterol: 162 mg/dL (ref <200)
HDL: 66 mg/dL (ref >=40)
LDL Cholesterol (Calc): 82 mg/dL (calc)
Non-HDL Cholesterol (Calc): 96 mg/dL (calc) (ref <130)
Total CHOL/HDL Ratio: 2.5 (calc) (ref <5.0)
Triglycerides: 55 mg/dL (ref <150)

## 2019-10-07 LAB — HEPATIC FUNCTION PANEL
AG Ratio: 1.9 (calc) (ref 1.0–2.5)
ALT: 13 U/L (ref 9–46)
AST: 14 U/L (ref 10–35)
Albumin: 4.6 g/dL (ref 3.6–5.1)
Alkaline phosphatase (APISO): 63 U/L (ref 35–144)
Bilirubin, Direct: 0.1 mg/dL (ref 0.0–0.2)
Globulin: 2.4 g/dL (calc) (ref 1.9–3.7)
Indirect Bilirubin: 0.3 mg/dL (calc) (ref 0.2–1.2)
Total Bilirubin: 0.4 mg/dL (ref 0.2–1.2)
Total Protein: 7 g/dL (ref 6.1–8.1)

## 2019-10-07 LAB — MICROALBUMIN / CREATININE URINE RATIO
Creatinine, Urine: 133 mg/dL (ref 20–320)
Microalb Creat Ratio: 67 mcg/mg creat — ABNORMAL HIGH (ref <30)
Microalb, Ur: 8.9 mg/dL

## 2019-10-07 LAB — BASIC METABOLIC PANEL
BUN: 19 mg/dL (ref 7–25)
CO2: 29 mmol/L (ref 20–32)
Calcium: 9.9 mg/dL (ref 8.6–10.3)
Chloride: 102 mmol/L (ref 98–110)
Creat: 1.08 mg/dL (ref 0.70–1.33)
Glucose, Bld: 92 mg/dL (ref 65–99)
Potassium: 4.1 mmol/L (ref 3.5–5.3)
Sodium: 139 mmol/L (ref 135–146)

## 2020-01-08 ENCOUNTER — Ambulatory Visit (INDEPENDENT_AMBULATORY_CARE_PROVIDER_SITE_OTHER): Payer: 59 | Admitting: Family Medicine

## 2020-01-08 ENCOUNTER — Encounter: Payer: Self-pay | Admitting: Family Medicine

## 2020-01-08 ENCOUNTER — Other Ambulatory Visit: Payer: Self-pay

## 2020-01-08 VITALS — BP 120/80 | HR 92 | Temp 98.2°F | Wt 205.5 lb

## 2020-01-08 DIAGNOSIS — E1165 Type 2 diabetes mellitus with hyperglycemia: Secondary | ICD-10-CM | POA: Diagnosis not present

## 2020-01-08 LAB — POCT GLYCOSYLATED HEMOGLOBIN (HGB A1C): Hemoglobin A1C: 7.2 % — AB (ref 4.0–5.6)

## 2020-01-08 NOTE — Progress Notes (Signed)
Established Patient Office Visit  Subjective:  Patient ID: Todd English, male    DOB: 1970-07-16  Age: 50 y.o. MRN: [id number]  CC: No chief complaint on file.   HPI Todd English presents for medical follow-up.  He has type 2 diabetes.  His A1c was improved to 5.8% last fall.  We stopped his sulfonylurea medication at that point.  He has been poorly compliant with diet he states over the past month.  Not monitoring blood sugars at home.  No polyuria or polydipsia.  He also had some positive urine microalbuminuria on recent lab screening.  He declines ACE inhibitor or ARB at this time.  We discussed implications.  He does have normal GFR.  Remains on combination therapy with Metformin and Trulicity for his diabetes.  His blood sugars have always been stable.  He is on simvastatin for hyperlipidemia.  Past Medical History:  Diagnosis Date  . Allergy   . Depression    Patient states he does not have depression.  . Diabetes mellitus   . GERD (gastroesophageal reflux disease)    Patient states he does not have GERD  . Hyperlipidemia     Past Surgical History:  Procedure Laterality Date  . no surgical hx      Family History  Problem Relation Age of Onset  . Diabetes Mellitus II Maternal Grandmother   . Colon cancer Neg Hx   . Colon polyps Neg Hx   . Esophageal cancer Neg Hx   . Rectal cancer Neg Hx   . Stomach cancer Neg Hx     Social History   Socioeconomic History  . Marital status: Married    Spouse name: Not on file  . Number of children: Not on file  . Years of education: Not on file  . Highest education level: Not on file  Occupational History  . Not on file  Tobacco Use  . Smoking status: Former Smoker    Packs/day: 1.00    Years: 15.00    Pack years: 15.00    Types: Cigarettes    Quit date: 12/26/2009    Years since quitting: 10.0  . Smokeless tobacco: Never Used  Vaping Use  . Vaping Use: Never used  Substance and Sexual Activity  . Alcohol  use: Yes    Alcohol/week: 2.0 standard drinks    Types: 2 Cans of beer per week  . Drug use: Never  . Sexual activity: Yes  Other Topics Concern  . Not on file  Social History Narrative  . Not on file   Social Determinants of Health   Financial Resource Strain: Not on file  Food Insecurity: Not on file  Transportation Needs: Not on file  Physical Activity: Not on file  Stress: Not on file  Social Connections: Not on file  Intimate Partner Violence: Not on file    Outpatient Medications Prior to Visit  Medication Sig Dispense Refill  . aspirin EC 81 MG tablet Take 81 mg by mouth daily.    . Blood Glucose Monitoring Suppl (ONETOUCH VERIO FLEX SYSTEM) w/Device KIT Use to check blood glucose two times daily. Dx Code E11.65 1 kit 1  . glucose blood (ONETOUCH VERIO) test strip Use as instructed to check blood glucose two times daily. Dx Code E11.65 200 each 3  . metFORMIN (GLUCOPHAGE) 1000 MG tablet TAKE 1 TABLET (1,000 MG TOTAL) BY MOUTH 2 (TWO) TIMES DAILY WITH A MEAL. 180 tablet 3  . OneTouch Delica Lancets 63O MISC Use  to check blood glucose two times daily. Dx Code E11.65 200 each 3  . simvastatin (ZOCOR) 40 MG tablet TAKE 1 TABLET BY MOUTH EVERY DAY 90 tablet 3  . TRULICITY 5.85 ID/7.8EU SOPN INJECT 0.75 MG INTO THE SKIN EVERY 7 (SEVEN) DAYS. 3 pen 12  . VITAMIN D PO Take 1 capsule by mouth daily.     No facility-administered medications prior to visit.    No Known Allergies  ROS Review of Systems  Constitutional: Negative for fatigue and unexpected weight change.  Eyes: Negative for visual disturbance.  Respiratory: Negative for cough, chest tightness and shortness of breath.   Cardiovascular: Negative for chest pain, palpitations and leg swelling.  Endocrine: Negative for polydipsia and polyuria.  Neurological: Negative for dizziness, syncope, weakness, light-headedness and headaches.      Objective:    Physical Exam Constitutional:      Appearance: He is  well-developed and well-nourished.  HENT:     Right Ear: External ear normal.     Left Ear: External ear normal.     Mouth/Throat:     Mouth: Oropharynx is clear and moist.  Eyes:     Pupils: Pupils are equal, round, and reactive to light.  Neck:     Thyroid: No thyromegaly.  Cardiovascular:     Rate and Rhythm: Normal rate and regular rhythm.  Pulmonary:     Effort: Pulmonary effort is normal. No respiratory distress.     Breath sounds: Normal breath sounds. No wheezing or rales.  Musculoskeletal:        General: No edema.     Cervical back: Neck supple.     Right lower leg: No edema.     Left lower leg: No edema.  Neurological:     Mental Status: He is alert and oriented to person, place, and time.     BP 120/80 (BP Location: Left Arm, Patient Position: Sitting, Cuff Size: Normal)   Pulse 92   Temp 98.2 F (36.8 C) (Oral)   Wt 205 lb 8 oz (93.2 kg)   SpO2 98%   BMI 27.87 kg/m  Wt Readings from Last 3 Encounters:  01/08/20 205 lb 8 oz (93.2 kg)  10/06/19 204 lb 8 oz (92.8 kg)  06/23/19 212 lb (96.2 kg)     Health Maintenance Due  Topic Date Due  . Hepatitis C Screening  Never done  . COVID-19 Vaccine (1) Never done  . HIV Screening  Never done    There are no preventive care reminders to display for this patient.  Lab Results  Component Value Date   TSH 3.29 07/27/2018   Lab Results  Component Value Date   WBC 6.7 03/03/2019   HGB 13.4 03/03/2019   HCT 41.2 03/03/2019   MCV 79.0 03/03/2019   PLT 523.0 (H) 03/03/2019   Lab Results  Component Value Date   NA 139 10/06/2019   K 4.1 10/06/2019   CO2 29 10/06/2019   GLUCOSE 92 10/06/2019   BUN 19 10/06/2019   CREATININE 1.08 10/06/2019   BILITOT 0.4 10/06/2019   ALKPHOS 50 02/23/2019   AST 14 10/06/2019   ALT 13 10/06/2019   PROT 7.0 10/06/2019   ALBUMIN 3.2 (L) 02/23/2019   CALCIUM 9.9 10/06/2019   ANIONGAP 12 02/23/2019   GFR 83.77 03/03/2019   Lab Results  Component Value Date   CHOL 162  10/06/2019   Lab Results  Component Value Date   HDL 66 10/06/2019   Lab Results  Component  Value Date   LDLCALC 82 10/06/2019   Lab Results  Component Value Date   TRIG 55 10/06/2019   Lab Results  Component Value Date   CHOLHDL 2.5 10/06/2019   Lab Results  Component Value Date   HGBA1C 7.2 (A) 01/08/2020      Assessment & Plan:   Problem List Items Addressed This Visit      Unprioritized   Type 2 diabetes mellitus, uncontrolled ([place]) - Primary   Relevant Orders   POCT glycosylated hemoglobin (Hb A1C) (Completed)    A1c today back up to 7.2%.  We discussed options of increasing Trulicity 1.5 versus additional medication versus 3 to 4 months of lifestyle management and tightening of diet and he prefers the latter.  We will plan to reassess in 4 months.  We also discussed recommendation to consider low-dose ACE inhibitor or ARB at this time because of microalbuminuria but he declines.  He does agree to repeating this at follow-up in 4 months.  If still up at that time we will readdress  No orders of the defined types were placed in this encounter.   Follow-up: Return in about 4 months (around 05/07/2020).    Carolann Littler, MD

## 2020-01-08 NOTE — Patient Instructions (Signed)
Tighten up diet and let's plan on 4 month follow up.  A1C today is 7.2%.  We want to see this < 7.

## 2020-02-27 ENCOUNTER — Other Ambulatory Visit: Payer: Self-pay | Admitting: Family Medicine

## 2020-05-07 ENCOUNTER — Ambulatory Visit (INDEPENDENT_AMBULATORY_CARE_PROVIDER_SITE_OTHER): Payer: 59 | Admitting: Family Medicine

## 2020-05-07 ENCOUNTER — Telehealth: Payer: Self-pay | Admitting: Family Medicine

## 2020-05-07 ENCOUNTER — Other Ambulatory Visit: Payer: Self-pay

## 2020-05-07 ENCOUNTER — Encounter: Payer: Self-pay | Admitting: Family Medicine

## 2020-05-07 VITALS — BP 140/84 | HR 95 | Temp 97.9°F | Wt 201.6 lb

## 2020-05-07 DIAGNOSIS — E785 Hyperlipidemia, unspecified: Secondary | ICD-10-CM | POA: Diagnosis not present

## 2020-05-07 DIAGNOSIS — E11319 Type 2 diabetes mellitus with unspecified diabetic retinopathy without macular edema: Secondary | ICD-10-CM | POA: Diagnosis not present

## 2020-05-07 LAB — POCT GLYCOSYLATED HEMOGLOBIN (HGB A1C): Hemoglobin A1C: 7.2 % — AB (ref 4.0–5.6)

## 2020-05-07 MED ORDER — DAPAGLIFLOZIN PROPANEDIOL 5 MG PO TABS: 5.0000 mg | ORAL_TABLET | Freq: Every day | ORAL | 5 refills | Status: DC

## 2020-05-07 NOTE — Progress Notes (Signed)
Established Patient Office Visit  Subjective:  Patient ID: Todd English, male    DOB: 1970/03/01  Age: 50 y.o. MRN: [id number]  CC: No chief complaint on file.   HPI Todd English presents for patient seen for follow-up regarding type 2 diabetes.  Last A1c was 7.2%.  We discussed possibly increasing his Trulicity but he recommended a trial of lifestyle modification.  His weight is down a few pounds.  Not exercising consistently.  Has tried to reduce snacking.  He is currently metformin and Trulicity 3.47 mg subcutaneous once weekly.  Not monitoring blood sugars regularly at home.  Generally tolerating medications without side effect.  No history of hypertension.  Blood pressure is up slightly today.  Generally blood pressures been good at home.  Did have some microalbumin on recent microalbumin creatinine ratio back in the fall.  He plans to get eye exam sometime this summer.  Past Medical History:  Diagnosis Date  . Allergy   . Depression    Patient states he does not have depression.  . Diabetes mellitus   . GERD (gastroesophageal reflux disease)    Patient states he does not have GERD  . Hyperlipidemia     Past Surgical History:  Procedure Laterality Date  . no surgical hx      Family History  Problem Relation Age of Onset  . Diabetes Mellitus II Maternal Grandmother   . Colon cancer Neg Hx   . Colon polyps Neg Hx   . Esophageal cancer Neg Hx   . Rectal cancer Neg Hx   . Stomach cancer Neg Hx     Social History   Socioeconomic History  . Marital status: Married    Spouse name: Not on file  . Number of children: Not on file  . Years of education: Not on file  . Highest education level: Not on file  Occupational History  . Not on file  Tobacco Use  . Smoking status: Former Smoker    Packs/day: 1.00    Years: 15.00    Pack years: 15.00    Types: Cigarettes    Quit date: 12/26/2009    Years since quitting: 10.3  . Smokeless tobacco: Never Used   Vaping Use  . Vaping Use: Never used  Substance and Sexual Activity  . Alcohol use: Yes    Alcohol/week: 2.0 standard drinks    Types: 2 Cans of beer per week  . Drug use: Never  . Sexual activity: Yes  Other Topics Concern  . Not on file  Social History Narrative  . Not on file   Social Determinants of Health   Financial Resource Strain: Not on file  Food Insecurity: Not on file  Transportation Needs: Not on file  Physical Activity: Not on file  Stress: Not on file  Social Connections: Not on file  Intimate Partner Violence: Not on file    Outpatient Medications Prior to Visit  Medication Sig Dispense Refill  . aspirin EC 81 MG tablet Take 81 mg by mouth daily.    . Blood Glucose Monitoring Suppl (ONETOUCH VERIO FLEX SYSTEM) w/Device KIT Use to check blood glucose two times daily. Dx Code E11.65 1 kit 1  . metFORMIN (GLUCOPHAGE) 1000 MG tablet TAKE 1 TABLET (1,000 MG TOTAL) BY MOUTH 2 (TWO) TIMES DAILY WITH A MEAL. 180 tablet 3  . OneTouch Delica Lancets 42V MISC Use to check blood glucose two times daily. Dx Code E11.65 200 each 3  . ONETOUCH VERIO test  strip USE AS INSTRUCTED TO CHECK BLOOD GLUCOSE TWO TIMES DAILY. DX CODE E11.65 200 strip 3  . simvastatin (ZOCOR) 40 MG tablet TAKE 1 TABLET BY MOUTH EVERY DAY 90 tablet 3  . TRULICITY 8.92 JJ/9.4RD SOPN INJECT 0.75 MG INTO THE SKIN EVERY 7 (SEVEN) DAYS. 3 pen 12  . VITAMIN D PO Take 1 capsule by mouth daily.     No facility-administered medications prior to visit.    No Known Allergies  ROS Review of Systems  Constitutional: Negative for fatigue.  Eyes: Negative for visual disturbance.  Respiratory: Negative for cough, chest tightness and shortness of breath.   Cardiovascular: Negative for chest pain, palpitations and leg swelling.  Endocrine: Negative for polydipsia and polyuria.  Neurological: Negative for dizziness, syncope, weakness, light-headedness and headaches.      Objective:    Physical  Exam Constitutional:      Appearance: He is well-developed.  Eyes:     Pupils: Pupils are equal, round, and reactive to light.  Neck:     Thyroid: No thyromegaly.  Cardiovascular:     Rate and Rhythm: Normal rate and regular rhythm.  Pulmonary:     Effort: Pulmonary effort is normal. No respiratory distress.     Breath sounds: Normal breath sounds. No wheezing or rales.  Musculoskeletal:     Cervical back: Neck supple.     Right lower leg: No edema.     Left lower leg: No edema.  Neurological:     Mental Status: He is alert and oriented to person, place, and time.     BP 140/84 (BP Location: Left Arm, Patient Position: Sitting, Cuff Size: Normal)   Pulse 95   Temp 97.9 F (36.6 C) (Oral)   Wt 201 lb 9.6 oz (91.4 kg)   SpO2 98%   BMI 27.34 kg/m  Wt Readings from Last 3 Encounters:  05/07/20 201 lb 9.6 oz (91.4 kg)  01/08/20 205 lb 8 oz (93.2 kg)  10/06/19 204 lb 8 oz (92.8 kg)     Health Maintenance Due  Topic Date Due  . Hepatitis C Screening  Never done  . COVID-19 Vaccine (1) Never done  . HIV Screening  Never done    There are no preventive care reminders to display for this patient.  Lab Results  Component Value Date   TSH 3.29 07/27/2018   Lab Results  Component Value Date   WBC 6.7 03/03/2019   HGB 13.4 03/03/2019   HCT 41.2 03/03/2019   MCV 79.0 03/03/2019   PLT 523.0 (H) 03/03/2019   Lab Results  Component Value Date   NA 139 10/06/2019   K 4.1 10/06/2019   CO2 29 10/06/2019   GLUCOSE 92 10/06/2019   BUN 19 10/06/2019   CREATININE 1.08 10/06/2019   BILITOT 0.4 10/06/2019   ALKPHOS 50 02/23/2019   AST 14 10/06/2019   ALT 13 10/06/2019   PROT 7.0 10/06/2019   ALBUMIN 3.2 (L) 02/23/2019   CALCIUM 9.9 10/06/2019   ANIONGAP 12 02/23/2019   GFR 83.77 03/03/2019   Lab Results  Component Value Date   CHOL 162 10/06/2019   Lab Results  Component Value Date   HDL 66 10/06/2019   Lab Results  Component Value Date   LDLCALC 82  10/06/2019   Lab Results  Component Value Date   TRIG 55 10/06/2019   Lab Results  Component Value Date   CHOLHDL 2.5 10/06/2019   Lab Results  Component Value Date   HGBA1C 7.2 (  A) 05/07/2020      Assessment & Plan:   Problem List Items Addressed This Visit      Unprioritized   Hyperlipidemia   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  History of recent microalbumin which is elevated.  We recommended consideration for SGLT2 medication with Farxiga 5 mg daily to try to help with reduction and progression of kidney disease.  He will continue with metformin and Trulicity.  Reassess in 3 months and recheck A1c along with urine microalbumin screen at that time.  If blood pressure not improved at that point consider low-dose ACE inhibitor or angiotensin receptor blocker.  Meds ordered this encounter  Medications  . dapagliflozin propanediol (FARXIGA) 5 MG TABS tablet    Sig: Take 1 tablet (5 mg total) by mouth daily before breakfast.    Dispense:  30 tablet    Refill:  5    Follow-up: Return in about 3 months (around 08/07/2020).    Carolann Littler, MD

## 2020-05-07 NOTE — Telephone Encounter (Signed)
Yes.   We mentioned this morning that he needs to take the new medication in addition to his existing diabetic medications.

## 2020-05-07 NOTE — Telephone Encounter (Signed)
The patient is getting ready to go pick up his new medication and is wanting to know if it is okay for him to take this new medication with his metFORMIN (GLUCOPHAGE) 1000 MG tablet and TRULICITY 3.41 DQ/2.2WL SOPN  Please advise

## 2020-05-07 NOTE — Telephone Encounter (Signed)
Spoke with the patient. He is aware that it is ok for him to take the new medication with his existing medication.

## 2020-05-07 NOTE — Patient Instructions (Signed)
Diabetes Mellitus and Exercise Exercising regularly is important for overall health, especially for people who have diabetes mellitus. Exercising is not only about losing weight. It has many other health benefits, such as increasing muscle strength and bone density and reducing body fat and stress. This leads to improved fitness, flexibility, and endurance, all of which result in better overall health. What are the benefits of exercise if I have diabetes? Exercise has many benefits for people with diabetes. They include:  Helping to lower and control blood sugar (glucose).  Helping the body to respond better to the hormone insulin by improving insulin sensitivity.  Reducing how much insulin the body needs.  Lowering the risk for heart disease by: ? Lowering "bad" cholesterol and triglyceride levels. ? Increasing "good" cholesterol levels. ? Lowering blood pressure. ? Lowering blood glucose levels. What is my activity plan? Your health care provider or certified diabetes educator can help you make a plan for the type and frequency of exercise that works for you. This is called your activity plan. Be sure to:  Get at least 150 minutes of medium-intensity or high-intensity exercise each week. Exercises may include brisk walking, biking, or water aerobics.  Do stretching and strengthening exercises, such as yoga or weight lifting, at least 2 times a week.  Spread out your activity over at least 3 days of the week.  Get some form of physical activity each day. ? Do not go more than 2 days in a row without some kind of physical activity. ? Avoid being inactive for more than 90 minutes at a time. Take frequent breaks to walk or stretch.  Choose exercises or activities that you enjoy. Set realistic goals.  Start slowly and gradually increase your exercise intensity over time.   How do I manage my diabetes during exercise? Monitor your blood glucose  Check your blood glucose before and  after exercising. If your blood glucose is: ? 240 mg/dL (13.3 mmol/L) or higher before you exercise, check your urine for ketones. These are chemicals created by the liver. If you have ketones in your urine, do not exercise until your blood glucose returns to normal. ? 100 mg/dL (5.6 mmol/L) or lower, eat a snack containing 15-20 grams of carbohydrate. Check your blood glucose 15 minutes after the snack to make sure that your glucose level is above 100 mg/dL (5.6 mmol/L) before you start your exercise.  Know the symptoms of low blood glucose (hypoglycemia) and how to treat it. Your risk for hypoglycemia increases during and after exercise. Follow these tips and your health care provider's instructions  Keep a carbohydrate snack that is fast-acting for use before, during, and after exercise to help prevent or treat hypoglycemia.  Avoid injecting insulin into areas of the body that are going to be exercised. For example, avoid injecting insulin into: ? Your arms, when you are about to play tennis. ? Your legs, when you are about to go jogging.  Keep records of your exercise habits. Doing this can help you and your health care provider adjust your diabetes management plan as needed. Write down: ? Food that you eat before and after you exercise. ? Blood glucose levels before and after you exercise. ? The type and amount of exercise you have done.  Work with your health care provider when you start a new exercise or activity. He or she may need to: ? Make sure that the activity is safe for you. ? Adjust your insulin, other medicines, and food that   you eat.  Drink plenty of water while you exercise. This prevents loss of water (dehydration) and problems caused by a lot of heat in the body (heat stroke).   Where to find more information  American Diabetes Association: www.diabetes.org Summary  Exercising regularly is important for overall health, especially for people who have diabetes  mellitus.  Exercising has many health benefits. It increases muscle strength and bone density and reduces body fat and stress. It also lowers and controls blood glucose.  Your health care provider or certified diabetes educator can help you make an activity plan for the type and frequency of exercise that works for you.  Work with your health care provider to make sure any new activity is safe for you. Also work with your health care provider to adjust your insulin, other medicines, and the food you eat. This information is not intended to replace advice given to you by your health care provider. Make sure you discuss any questions you have with your health care provider. Document Revised: 09/19/2018 Document Reviewed: 09/19/2018 Elsevier Patient Education  2021 [employer]  

## 2020-06-02 ENCOUNTER — Other Ambulatory Visit: Payer: Self-pay | Admitting: Family Medicine

## 2020-06-05 ENCOUNTER — Other Ambulatory Visit: Payer: Self-pay | Admitting: Family Medicine

## 2020-08-06 ENCOUNTER — Other Ambulatory Visit: Payer: Self-pay

## 2020-08-07 ENCOUNTER — Ambulatory Visit (INDEPENDENT_AMBULATORY_CARE_PROVIDER_SITE_OTHER): Payer: 59 | Admitting: Family Medicine

## 2020-08-07 ENCOUNTER — Encounter: Payer: Self-pay | Admitting: Family Medicine

## 2020-08-07 VITALS — BP 150/94 | HR 91 | Temp 97.6°F | Wt 197.1 lb

## 2020-08-07 DIAGNOSIS — E1165 Type 2 diabetes mellitus with hyperglycemia: Secondary | ICD-10-CM | POA: Diagnosis not present

## 2020-08-07 DIAGNOSIS — R030 Elevated blood-pressure reading, without diagnosis of hypertension: Secondary | ICD-10-CM | POA: Diagnosis not present

## 2020-08-07 LAB — POCT GLYCOSYLATED HEMOGLOBIN (HGB A1C): Hemoglobin A1C: 6.4 % — AB (ref 4.0–5.6)

## 2020-08-07 NOTE — Progress Notes (Signed)
Established Patient Office Visit  Subjective:  Patient ID: Todd English, male    DOB: 1970/05/08  Age: 50 y.o. MRN: [id number]  CC:  Chief Complaint  Patient presents with   Follow-up    diabetes    HPI Todd English presents for follow-up regarding type 2 diabetes.  We had added [place] recently.  He also takes Trulicity and metformin.  Not monitoring blood sugars regularly.  Last A1c was 7.2%.  This is improved to 6.4% today.  His weight is down about 4 pounds from last visit.  Not exercising consistently.  He also remains on simvastatin for hyperlipidemia.  Denies any side effects from medications.  Blood pressure is up today.  He is not been diagnosed with hypertension in the past.  Blood pressures have generally been well controlled in the past.  He does have a home monitor but thinks this is not reading accurately.  No regular alcohol use.  Tries to watch sodium intake closely.  Past Medical History:  Diagnosis Date   Allergy    Depression    Patient states he does not have depression.   Diabetes mellitus    GERD (gastroesophageal reflux disease)    Patient states he does not have GERD   Hyperlipidemia     Past Surgical History:  Procedure Laterality Date   no surgical hx      Family History  Problem Relation Age of Onset   Diabetes Mellitus II Maternal Grandmother    Colon cancer Neg Hx    Colon polyps Neg Hx    Esophageal cancer Neg Hx    Rectal cancer Neg Hx    Stomach cancer Neg Hx     Social History   Socioeconomic History   Marital status: Married    Spouse name: Not on file   Number of children: Not on file   Years of education: Not on file   Highest education level: Not on file  Occupational History   Not on file  Tobacco Use   Smoking status: Former    Packs/day: 1.00    Years: 15.00    Pack years: 15.00    Types: Cigarettes    Quit date: 12/26/2009    Years since quitting: 10.6   Smokeless tobacco: Never  Vaping Use   Vaping  Use: Never used  Substance and Sexual Activity   Alcohol use: Yes    Alcohol/week: 2.0 standard drinks    Types: 2 Cans of beer per week   Drug use: Never   Sexual activity: Yes  Other Topics Concern   Not on file  Social History Narrative   Not on file   Social Determinants of Health   Financial Resource Strain: Not on file  Food Insecurity: Not on file  Transportation Needs: Not on file  Physical Activity: Not on file  Stress: Not on file  Social Connections: Not on file  Intimate Partner Violence: Not on file    Outpatient Medications Prior to Visit  Medication Sig Dispense Refill   aspirin EC 81 MG tablet Take 81 mg by mouth daily.     Blood Glucose Monitoring Suppl ([place]) w/Device KIT Use to check blood glucose two times daily. Dx Code E11.65 1 kit 1   dapagliflozin propanediol (FARXIGA) 5 MG TABS tablet Take 1 tablet (5 mg total) by mouth daily before breakfast. 30 tablet 5   metFORMIN (GLUCOPHAGE) 1000 MG tablet TAKE 1 TABLET (1,000 MG TOTAL) BY MOUTH 2 (TWO)  TIMES DAILY WITH A MEAL. 180 tablet 3   OneTouch Delica Lancets 82N MISC Use to check blood glucose two times daily. Dx Code E11.65 200 each 3   ONETOUCH VERIO test strip USE AS INSTRUCTED TO CHECK BLOOD GLUCOSE TWO TIMES DAILY. DX CODE E11.65 200 strip 3   simvastatin (ZOCOR) 40 MG tablet TAKE 1 TABLET BY MOUTH EVERY DAY 90 tablet 3   TRULICITY 5.62 ZH/0.8MV SOPN INJECT 0.75 MG INTO THE SKIN EVERY 7 (SEVEN) DAYS. 2 mL 12   VITAMIN D PO Take 1 capsule by mouth daily.     No facility-administered medications prior to visit.    No Known Allergies  ROS Review of Systems  Constitutional:  Negative for fatigue.  Eyes:  Negative for visual disturbance.  Respiratory:  Negative for cough, chest tightness and shortness of breath.   Cardiovascular:  Negative for chest pain, palpitations and leg swelling.  Endocrine: Negative for polydipsia and polyuria.  Neurological:  Negative for dizziness,  syncope, weakness, light-headedness and headaches.     Objective:    Physical Exam Vitals reviewed.  Constitutional:      Appearance: Normal appearance.  Cardiovascular:     Rate and Rhythm: Normal rate and regular rhythm.  Pulmonary:     Effort: Pulmonary effort is normal.     Breath sounds: Normal breath sounds.  Musculoskeletal:     Right lower leg: No edema.     Left lower leg: No edema.  Neurological:     Mental Status: He is alert.    BP (!) 150/94 (BP Location: Left Arm, Patient Position: Sitting, Cuff Size: Normal)   Pulse 91   Temp 97.6 F (36.4 C) (Oral)   Wt 197 lb 1.6 oz (89.4 kg)   SpO2 99%   BMI 26.73 kg/m  Wt Readings from Last 3 Encounters:  08/07/20 197 lb 1.6 oz (89.4 kg)  05/07/20 201 lb 9.6 oz (91.4 kg)  01/08/20 205 lb 8 oz (93.2 kg)     Health Maintenance Due  Topic Date Due   COVID-19 Vaccine (1) Never done   HIV Screening  Never done   Hepatitis C Screening  Never done   Zoster Vaccines- Shingrix (1 of 2) Never done   OPHTHALMOLOGY EXAM  05/15/2020   INFLUENZA VACCINE  08/05/2020   URINE MICROALBUMIN  10/05/2020    There are no preventive care reminders to display for this patient.  Lab Results  Component Value Date   TSH 3.29 07/27/2018   Lab Results  Component Value Date   WBC 6.7 03/03/2019   HGB 13.4 03/03/2019   HCT 41.2 03/03/2019   MCV 79.0 03/03/2019   PLT 523.0 (H) 03/03/2019   Lab Results  Component Value Date   NA 139 10/06/2019   K 4.1 10/06/2019   CO2 29 10/06/2019   GLUCOSE 92 10/06/2019   BUN 19 10/06/2019   CREATININE 1.08 10/06/2019   BILITOT 0.4 10/06/2019   ALKPHOS 50 02/23/2019   AST 14 10/06/2019   ALT 13 10/06/2019   PROT 7.0 10/06/2019   ALBUMIN 3.2 (L) 02/23/2019   CALCIUM 9.9 10/06/2019   ANIONGAP 12 02/23/2019   GFR 83.77 03/03/2019   Lab Results  Component Value Date   CHOL 162 10/06/2019   Lab Results  Component Value Date   HDL 66 10/06/2019   Lab Results  Component Value Date    LDLCALC 82 10/06/2019   Lab Results  Component Value Date   TRIG 55 10/06/2019   Lab  Results  Component Value Date   CHOLHDL 2.5 10/06/2019   Lab Results  Component Value Date   HGBA1C 6.4 (A) 08/07/2020      Assessment & Plan:   Problem List Items Addressed This Visit       Unprioritized   Type 2 diabetes mellitus, uncontrolled ([place]) - Primary   Relevant Orders   POCT glycosylated hemoglobin (Hb A1C) (Completed)   Other Visit Diagnoses     Elevated blood pressure reading         Type 2 diabetes improved with A1c 6.4%.  He is pleased with his current regimen.  Continue current regimen and plan 17-monthfollow-up.  Plan full labs then including urine microalbumin and lipids  Regarding elevated blood pressure he is reluctant to start medication at this time.  Information given on DASH diet.  Step of aerobic exercise.  Reassess in 3 months and if not improved at that time consider either ARB or ACE inhibitor possibly  No orders of the defined types were placed in this encounter.   Follow-up: Return in about 3 months (around 11/07/2020).    BCarolann Littler MD

## 2020-08-07 NOTE — Patient Instructions (Signed)
A1C improved to 6.4%  Monitor blood pressure several times per week at home if possible.

## 2020-10-21 ENCOUNTER — Other Ambulatory Visit: Payer: Self-pay | Admitting: Family Medicine

## 2020-11-08 ENCOUNTER — Other Ambulatory Visit: Payer: Self-pay

## 2020-11-08 ENCOUNTER — Ambulatory Visit (INDEPENDENT_AMBULATORY_CARE_PROVIDER_SITE_OTHER): Payer: 59 | Admitting: Family Medicine

## 2020-11-08 VITALS — BP 132/84 | HR 85 | Temp 97.9°F | Wt 198.3 lb

## 2020-11-08 DIAGNOSIS — E11319 Type 2 diabetes mellitus with unspecified diabetic retinopathy without macular edema: Secondary | ICD-10-CM

## 2020-11-08 DIAGNOSIS — E785 Hyperlipidemia, unspecified: Secondary | ICD-10-CM | POA: Diagnosis not present

## 2020-11-08 LAB — LIPID PANEL
Cholesterol: 197 mg/dL (ref 0–200)
HDL: 59.3 mg/dL (ref >39.00)
LDL Cholesterol: 114 mg/dL — ABNORMAL HIGH (ref 0–99)
NonHDL: 137.57
Total CHOL/HDL Ratio: 3
Triglycerides: 119 mg/dL (ref 0.0–149.0)
VLDL: 23.8 mg/dL (ref 0.0–40.0)

## 2020-11-08 LAB — HEPATIC FUNCTION PANEL
ALT: 10 U/L (ref 0–53)
AST: 14 U/L (ref 0–37)
Albumin: 4.4 g/dL (ref 3.5–5.2)
Alkaline Phosphatase: 74 U/L (ref 39–117)
Bilirubin, Direct: 0 mg/dL (ref 0.0–0.3)
Total Bilirubin: 0.3 mg/dL (ref 0.2–1.2)
Total Protein: 6.7 g/dL (ref 6.0–8.3)

## 2020-11-08 LAB — MICROALBUMIN / CREATININE URINE RATIO
Creatinine,U: 122.9 mg/dL
Microalb Creat Ratio: 1.6 mg/g (ref 0.0–30.0)
Microalb, Ur: 2 mg/dL — ABNORMAL HIGH (ref 0.0–1.9)

## 2020-11-08 LAB — BASIC METABOLIC PANEL
BUN: 17 mg/dL (ref 6–23)
CO2: 30 mEq/L (ref 19–32)
Calcium: 9.2 mg/dL (ref 8.4–10.5)
Chloride: 104 mEq/L (ref 96–112)
Creatinine, Ser: 1.31 mg/dL (ref 0.40–1.50)
GFR: 62.68 mL/min (ref >60.00)
Glucose, Bld: 101 mg/dL — ABNORMAL HIGH (ref 70–99)
Potassium: 4.3 mEq/L (ref 3.5–5.1)
Sodium: 140 mEq/L (ref 135–145)

## 2020-11-08 NOTE — Progress Notes (Signed)
Established Patient Office Visit  Subjective:  Patient ID: Todd English, male    DOB: [birth year]  Age: 50 y.o. MRN: [id number]  CC: No chief complaint on file.   HPI Todd English presents for medical follow-up.  He has hyperlipidemia and type 2 diabetes.  Last A1c was 6.4%.  Not monitoring sugars regularly.  No polyuria or polydipsia.  Compliant with medications.  He is on simvastatin for hyperlipidemia.  No recent myalgias.  No chest pains.  He had recent eye exam which was normal.  This was back in September.  No neuropathy symptoms.  No history of hypertension.  He has a son playing basketball at [employer] and he plans to stay busy with following their games this season.  He declines flu vaccine.  Also declines shingles vaccine and Pneumovax.  Past Medical History:  Diagnosis Date   Allergy    Depression    Patient states he does not have depression.   Diabetes mellitus    GERD (gastroesophageal reflux disease)    Patient states he does not have GERD   Hyperlipidemia     Past Surgical History:  Procedure Laterality Date   no surgical hx      Family History  Problem Relation Age of Onset   Diabetes Mellitus II Maternal Grandmother    Colon cancer Neg Hx    Colon polyps Neg Hx    Esophageal cancer Neg Hx    Rectal cancer Neg Hx    Stomach cancer Neg Hx     Social History   Socioeconomic History   Marital status: Married    Spouse name: Not on file   Number of children: Not on file   Years of education: Not on file   Highest education level: Not on file  Occupational History   Not on file  Tobacco Use   Smoking status: Former    Packs/day: 1.00    Years: 15.00    Pack years: 15.00    Types: Cigarettes    Quit date: 12/26/2009    Years since quitting: 10.8   Smokeless tobacco: Never  Vaping Use   Vaping Use: Never used  Substance and Sexual Activity   Alcohol use: Yes    Alcohol/week: 2.0 standard drinks    Types: 2 Cans of  beer per week   Drug use: Never   Sexual activity: Yes  Other Topics Concern   Not on file  Social History Narrative   Not on file   Social Determinants of Health   Financial Resource Strain: Not on file  Food Insecurity: Not on file  Transportation Needs: Not on file  Physical Activity: Not on file  Stress: Not on file  Social Connections: Not on file  Intimate Partner Violence: Not on file    Outpatient Medications Prior to Visit  Medication Sig Dispense Refill   aspirin EC 81 MG tablet Take 81 mg by mouth daily.     Blood Glucose Monitoring Suppl ([place]) w/Device KIT Use to check blood glucose two times daily. Dx Code E11.65 1 kit 1   FARXIGA 5 MG TABS tablet TAKE 1 TABLET BY MOUTH DAILY BEFORE BREAKFAST. 30 tablet 5   metFORMIN (GLUCOPHAGE) 1000 MG tablet TAKE 1 TABLET (1,000 MG TOTAL) BY MOUTH 2 (TWO) TIMES DAILY WITH A MEAL. 180 tablet 3   OneTouch Delica Lancets 28M MISC Use to check blood glucose two times daily. Dx Code E11.65 200 each 3   ONETOUCH  VERIO test strip USE AS INSTRUCTED TO CHECK BLOOD GLUCOSE TWO TIMES DAILY. DX CODE E11.65 200 strip 3   simvastatin (ZOCOR) 40 MG tablet TAKE 1 TABLET BY MOUTH EVERY DAY 90 tablet 3   TRULICITY 9.45 WT/8.8EK SOPN INJECT 0.75 MG INTO THE SKIN EVERY 7 (SEVEN) DAYS. 2 mL 12   VITAMIN D PO Take 1 capsule by mouth daily.     No facility-administered medications prior to visit.    No Known Allergies  ROS Review of Systems  Constitutional:  Negative for fatigue.  Eyes:  Negative for visual disturbance.  Respiratory:  Negative for cough, chest tightness and shortness of breath.   Cardiovascular:  Negative for chest pain, palpitations and leg swelling.  Endocrine: Negative for polydipsia and polyuria.  Neurological:  Negative for dizziness, syncope, weakness, light-headedness and headaches.     Objective:    Physical Exam Constitutional:      Appearance: He is well-developed.  HENT:     Right Ear:  External ear normal.     Left Ear: External ear normal.  Eyes:     Pupils: Pupils are equal, round, and reactive to light.  Neck:     Thyroid: No thyromegaly.  Cardiovascular:     Rate and Rhythm: Normal rate and regular rhythm.  Pulmonary:     Effort: Pulmonary effort is normal. No respiratory distress.     Breath sounds: Normal breath sounds. No wheezing or rales.  Musculoskeletal:     Cervical back: Neck supple.  Skin:    Comments: Feet reveal no skin lesions. Good distal foot pulses. Good capillary refill. No calluses. Normal sensation with monofilament testing   Neurological:     Mental Status: He is alert and oriented to person, place, and time.    BP 132/84 (BP Location: Left Arm, Patient Position: Sitting, Cuff Size: Normal)   Pulse 85   Temp 97.9 F (36.6 C) (Oral)   Wt 198 lb 4.8 oz (89.9 kg)   SpO2 100%   BMI 26.89 kg/m  Wt Readings from Last 3 Encounters:  11/08/20 198 lb 4.8 oz (89.9 kg)  08/07/20 197 lb 1.6 oz (89.4 kg)  05/07/20 201 lb 9.6 oz (91.4 kg)     Health Maintenance Due  Topic Date Due   COVID-19 Vaccine (1) Never done   HIV Screening  Never done   Hepatitis C Screening  Never done   FOOT EXAM  10/05/2020   URINE MICROALBUMIN  10/05/2020    There are no preventive care reminders to display for this patient.  Lab Results  Component Value Date   TSH 3.29 07/27/2018   Lab Results  Component Value Date   WBC 6.7 03/03/2019   HGB 13.4 03/03/2019   HCT 41.2 03/03/2019   MCV 79.0 03/03/2019   PLT 523.0 (H) 03/03/2019   Lab Results  Component Value Date   NA 139 10/06/2019   K 4.1 10/06/2019   CO2 29 10/06/2019   GLUCOSE 92 10/06/2019   BUN 19 10/06/2019   CREATININE 1.08 10/06/2019   BILITOT 0.4 10/06/2019   ALKPHOS 50 02/23/2019   AST 14 10/06/2019   ALT 13 10/06/2019   PROT 7.0 10/06/2019   ALBUMIN 3.2 (L) 02/23/2019   CALCIUM 9.9 10/06/2019   ANIONGAP 12 02/23/2019   GFR 83.77 03/03/2019   Lab Results  Component Value  Date   CHOL 162 10/06/2019   Lab Results  Component Value Date   HDL 66 10/06/2019   Lab Results  Component  Value Date   LDLCALC 82 10/06/2019   Lab Results  Component Value Date   TRIG 55 10/06/2019   Lab Results  Component Value Date   CHOLHDL 2.5 10/06/2019   Lab Results  Component Value Date   HGBA1C 6.4 (A) 08/07/2020      Assessment & Plan:   Problem List Items Addressed This Visit       Unprioritized   Type 2 diabetes mellitus with retinopathy ([place]) - Primary   Relevant Orders   Basic metabolic panel   Microalbumin / creatinine urine ratio   Hyperlipidemia   Relevant Orders   Lipid panel   Hepatic function panel  A1c remains stable at 6.7%.  We will plan 35-monthroutine follow-up.  Obtain labs today as above.  Continue current medications including Trulicity, metformin, Farxiga, simvastatin  We recommended flu vaccine and Pneumovax and he declines  No orders of the defined types were placed in this encounter.   Follow-up: Return in about 6 months (around 05/08/2021).    BCarolann Littler MD

## 2020-11-08 NOTE — Addendum Note (Signed)
Addended by: Amanda Cockayne on: 11/08/2020 07:56 AM   Modules accepted: Orders

## 2020-11-11 ENCOUNTER — Other Ambulatory Visit: Payer: Self-pay | Admitting: Family Medicine

## 2020-11-13 ENCOUNTER — Telehealth: Payer: Self-pay | Admitting: Family Medicine

## 2020-11-14 MED ORDER — SIMVASTATIN 40 MG PO TABS: 40.0000 mg | ORAL_TABLET | Freq: Every day | ORAL | 3 refills | Status: DC

## 2020-11-14 NOTE — Telephone Encounter (Signed)
Rx was sent in on 11/11/2020 for a year supply. Spoke with the patient and he stated he will contact is pharmacy.

## 2020-11-14 NOTE — Addendum Note (Signed)
Addended by: Rebecca Eaton on: 11/14/2020 02:47 PM   Modules accepted: Orders

## 2020-11-14 NOTE — Telephone Encounter (Signed)
Patient called again to follow up that the pharmacy is still saying that they have not heard anything from our office. Patient states [facility] will have to call pharmacy and see what is going on.      Please advise

## 2020-11-14 NOTE — Telephone Encounter (Signed)
Patient called to get refill on simvastatin (ZOCOR) 40 MG tablet   Please send to    [id number] - [place], [place]     Good callback number is [phone]    Please advise

## 2020-11-14 NOTE — Telephone Encounter (Signed)
Rx has been re-sent to the pharmacy. Spoke with the patient and he is aware.

## 2020-12-19 ENCOUNTER — Telehealth: Payer: Self-pay | Admitting: Family Medicine

## 2020-12-19 NOTE — Telephone Encounter (Signed)
Spoke to patient and let him know there is a Trulicity savings card for him to pick up at the front desk.

## 2020-12-19 NOTE — Telephone Encounter (Signed)
Please assist. Do you know if we have any coupons for this?

## 2020-12-19 NOTE — Telephone Encounter (Signed)
Pt needs another coupon for TRULICITY 3.29 VB/1.6OM SOPN.  Pt does not need refill the cost is 175.00 w/out coupon. Pt said he received coupon last year  [id number] - [place], [address] Phone:  [phone]  Fax:  [phone]

## 2021-01-15 DIAGNOSIS — Z20822 Contact with and (suspected) exposure to covid-19: Secondary | ICD-10-CM | POA: Diagnosis not present

## 2021-01-15 DIAGNOSIS — U071 COVID-19: Secondary | ICD-10-CM | POA: Diagnosis not present

## 2021-02-03 ENCOUNTER — Telehealth: Payer: Self-pay | Admitting: Family Medicine

## 2021-02-03 MED ORDER — TRULICITY 0.75 MG/0.5ML ~~LOC~~ SOAJ: SUBCUTANEOUS | 12 refills | Status: DC

## 2021-02-03 NOTE — Telephone Encounter (Signed)
Pt call and stated he want you to give him a call back to talk his Trulicity.

## 2021-02-03 NOTE — Telephone Encounter (Signed)
Rx sent in

## 2021-02-03 NOTE — Telephone Encounter (Signed)
Pt call and stated he need a refill on TRULICITY 3.08 FQ/8.3AZ SOPN  [facility] [address]

## 2021-02-04 NOTE — Telephone Encounter (Signed)
Left message for patient to call back  

## 2021-02-04 NOTE — Telephone Encounter (Signed)
Spoke with the patient. He stated that his trulicity needs a PA.

## 2021-02-05 ENCOUNTER — Telehealth: Payer: Self-pay | Admitting: Family Medicine

## 2021-02-05 NOTE — Telephone Encounter (Signed)
Didn't receive PA sheet from pharmacy.   Attempted to complete Prior authorization in Cover my meds for patient,unable to complete due to insurance information not being updated.     Called patient will bring copy of card to office so prior authorization can be complete.     Will complete once updated insurance information is received.

## 2021-02-05 NOTE — Telephone Encounter (Signed)
Noted.   Will complete PA

## 2021-02-05 NOTE — Telephone Encounter (Signed)
Patient stopped by to update insurance, wanted to let [facility] know as FYI that insurance has changed.     FYI

## 2021-02-05 NOTE — Telephone Encounter (Signed)
Patient notified.  Prior authorization approved for Trulicity from 08/11/8546 to 02/04/2022.

## 2021-02-10 ENCOUNTER — Telehealth: Payer: Self-pay | Admitting: Family Medicine

## 2021-02-10 NOTE — Telephone Encounter (Signed)
[place]: [id number] PA sent to cover my meds.

## 2021-02-10 NOTE — Telephone Encounter (Signed)
Pt is calling and he needs PA for faxiga without PA the cost is 206.00 he would like dr burchette nurse to call him back

## 2021-02-17 NOTE — Telephone Encounter (Signed)
Checked on the status of PA. It stated that PA not needed.  Called the patient and he stated that the medication is 206.00 with insurance coverage. He would like to know if there is something more affordable that Dr. Elease Hashimoto could prescribe.   Please advise.

## 2021-02-17 NOTE — Telephone Encounter (Signed)
Spoke with the patient and discussed Dr. Anastasio Auerbach message in detail. Patient was advise to go online and see if the manufacture offers patient assistance or a coupon before changing medication. Patient expressed understanding and stated he will look into this and call back if he still is unable to get the medication.  Nothing further needed at this time.

## 2021-02-26 IMAGING — DX DG CHEST 1V PORT
1 series · 1 of 1 positions shown · non-contrast
Comparison: None.

CLINICAL DATA: Acute hypoxic respiratory failure due to COVID 19.

EXAM:
PORTABLE CHEST 1 VIEW

[chest]
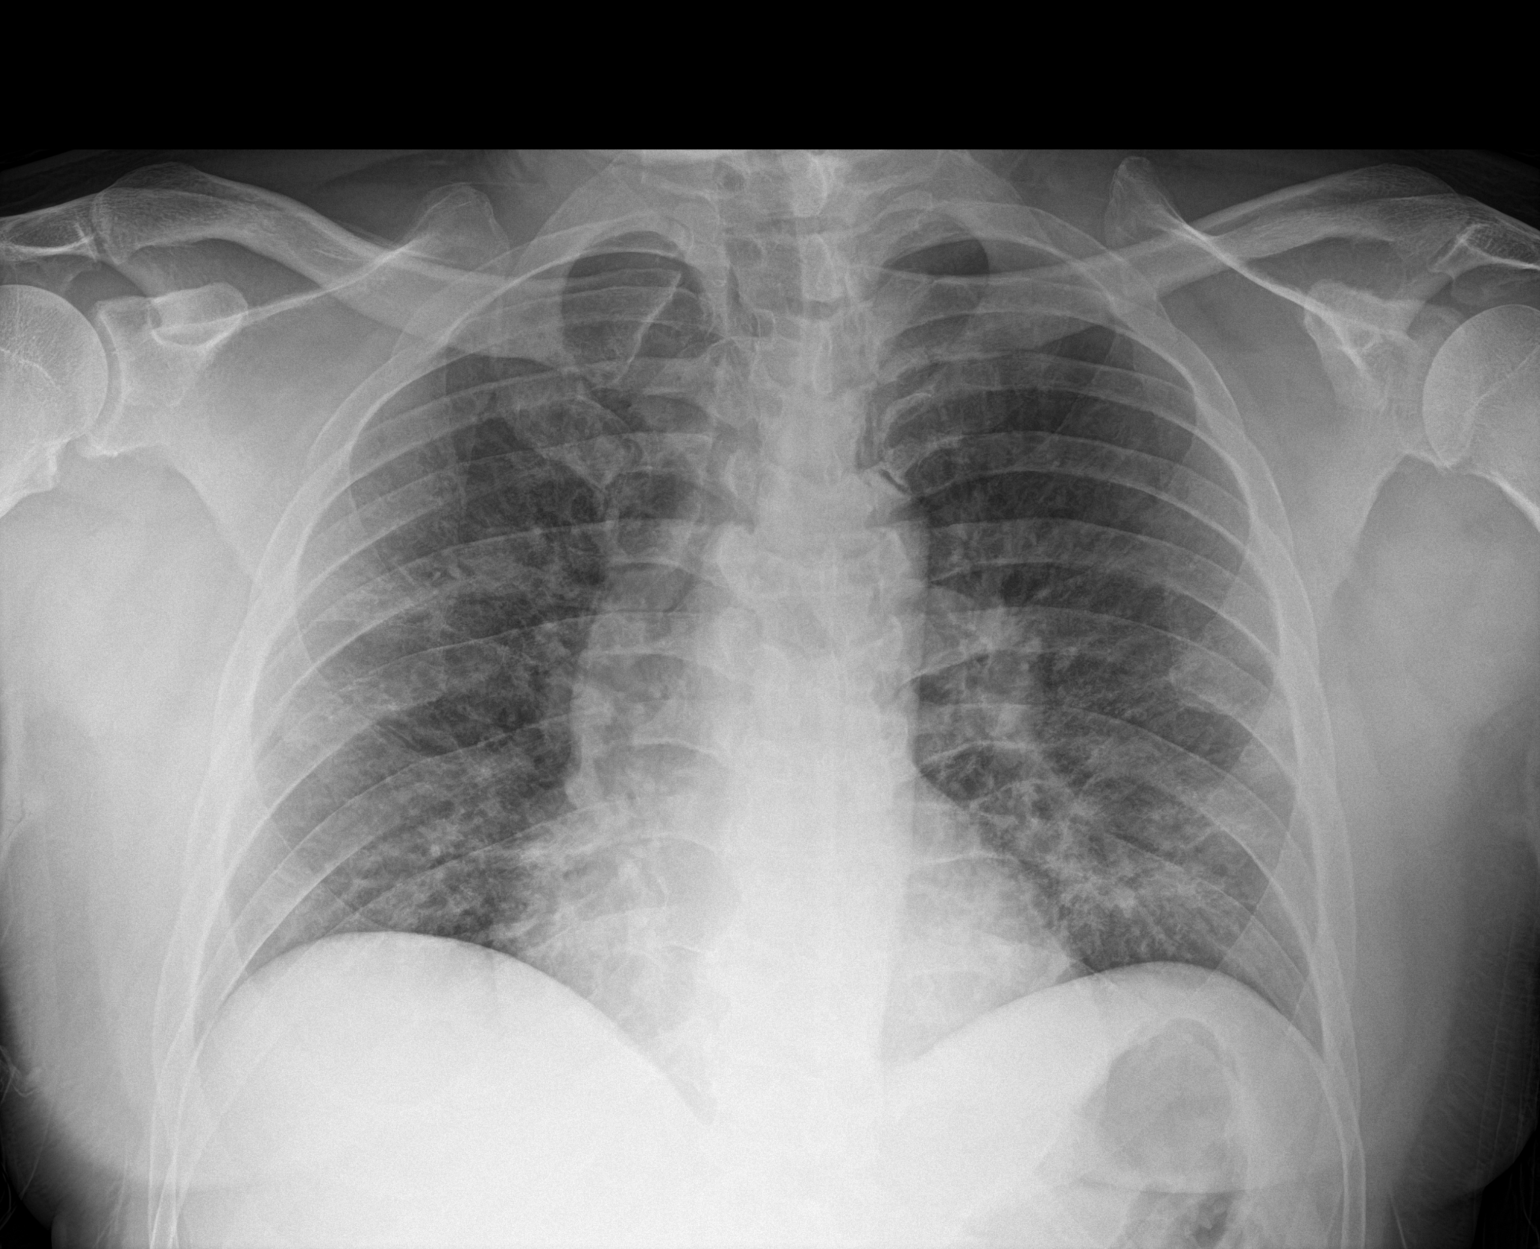

[1 of 1 positions shown; findings below may reference images not displayed]

FINDINGS: The cardiac silhouette, mediastinal and hilar contours are within
normal limits.

Patchy ill-defined bilateral interstitial and airspace infiltrates
consistent with COVID pneumonia. No pleural effusion or focal
pulmonary lesions. The bony thorax is intact.
IMPRESSION: Bilateral infiltrates consistent with COVID pneumonia.

## 2021-03-09 IMAGING — DX DG CHEST 2V
2 series · 2 of 2 positions shown · non-contrast
Comparison: Chest radiograph 02/20/2019

CLINICAL DATA: History of TBRNY-ID with bilateral pneumonia.

EXAM:
CHEST - 2 VIEW

[chest pa]
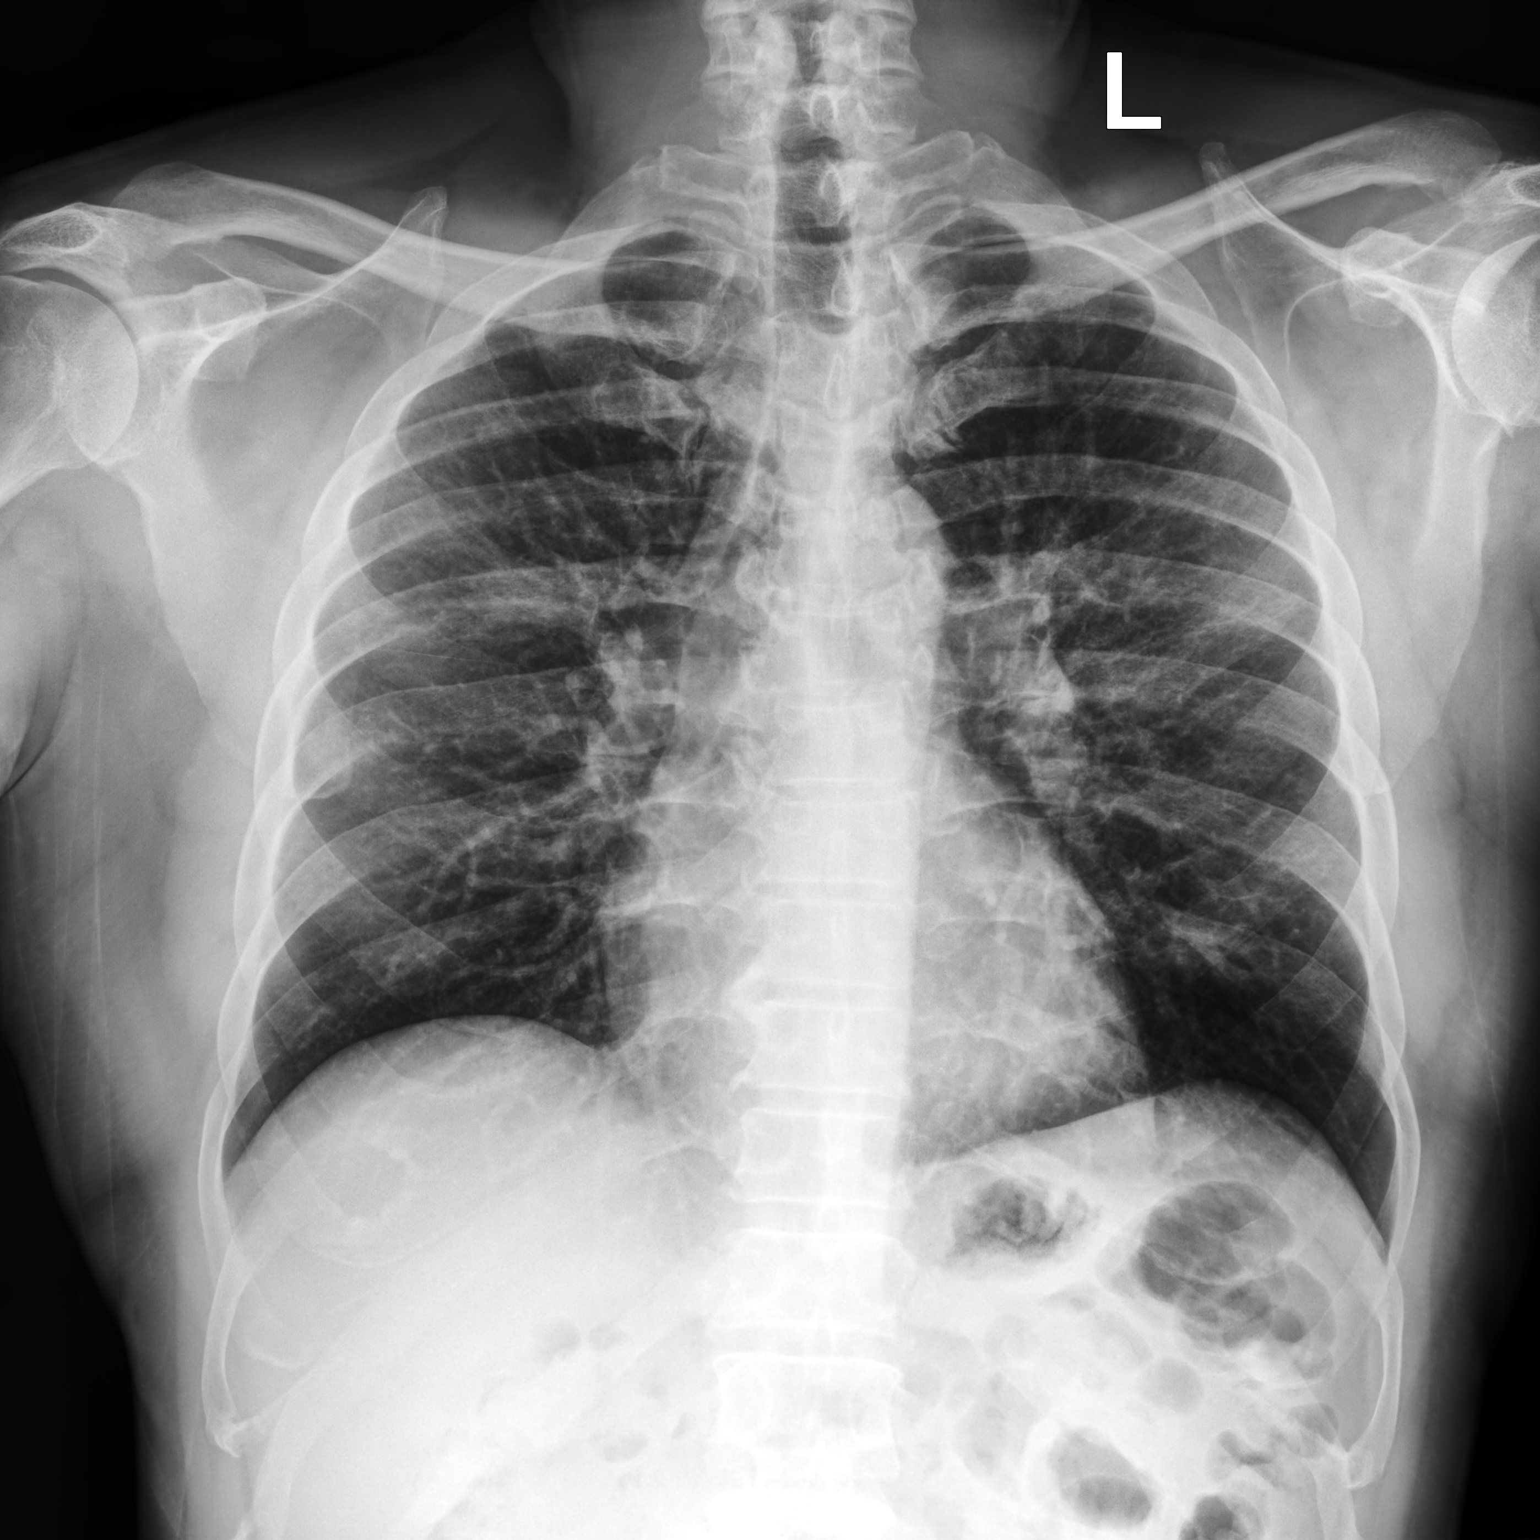

[chest lat]
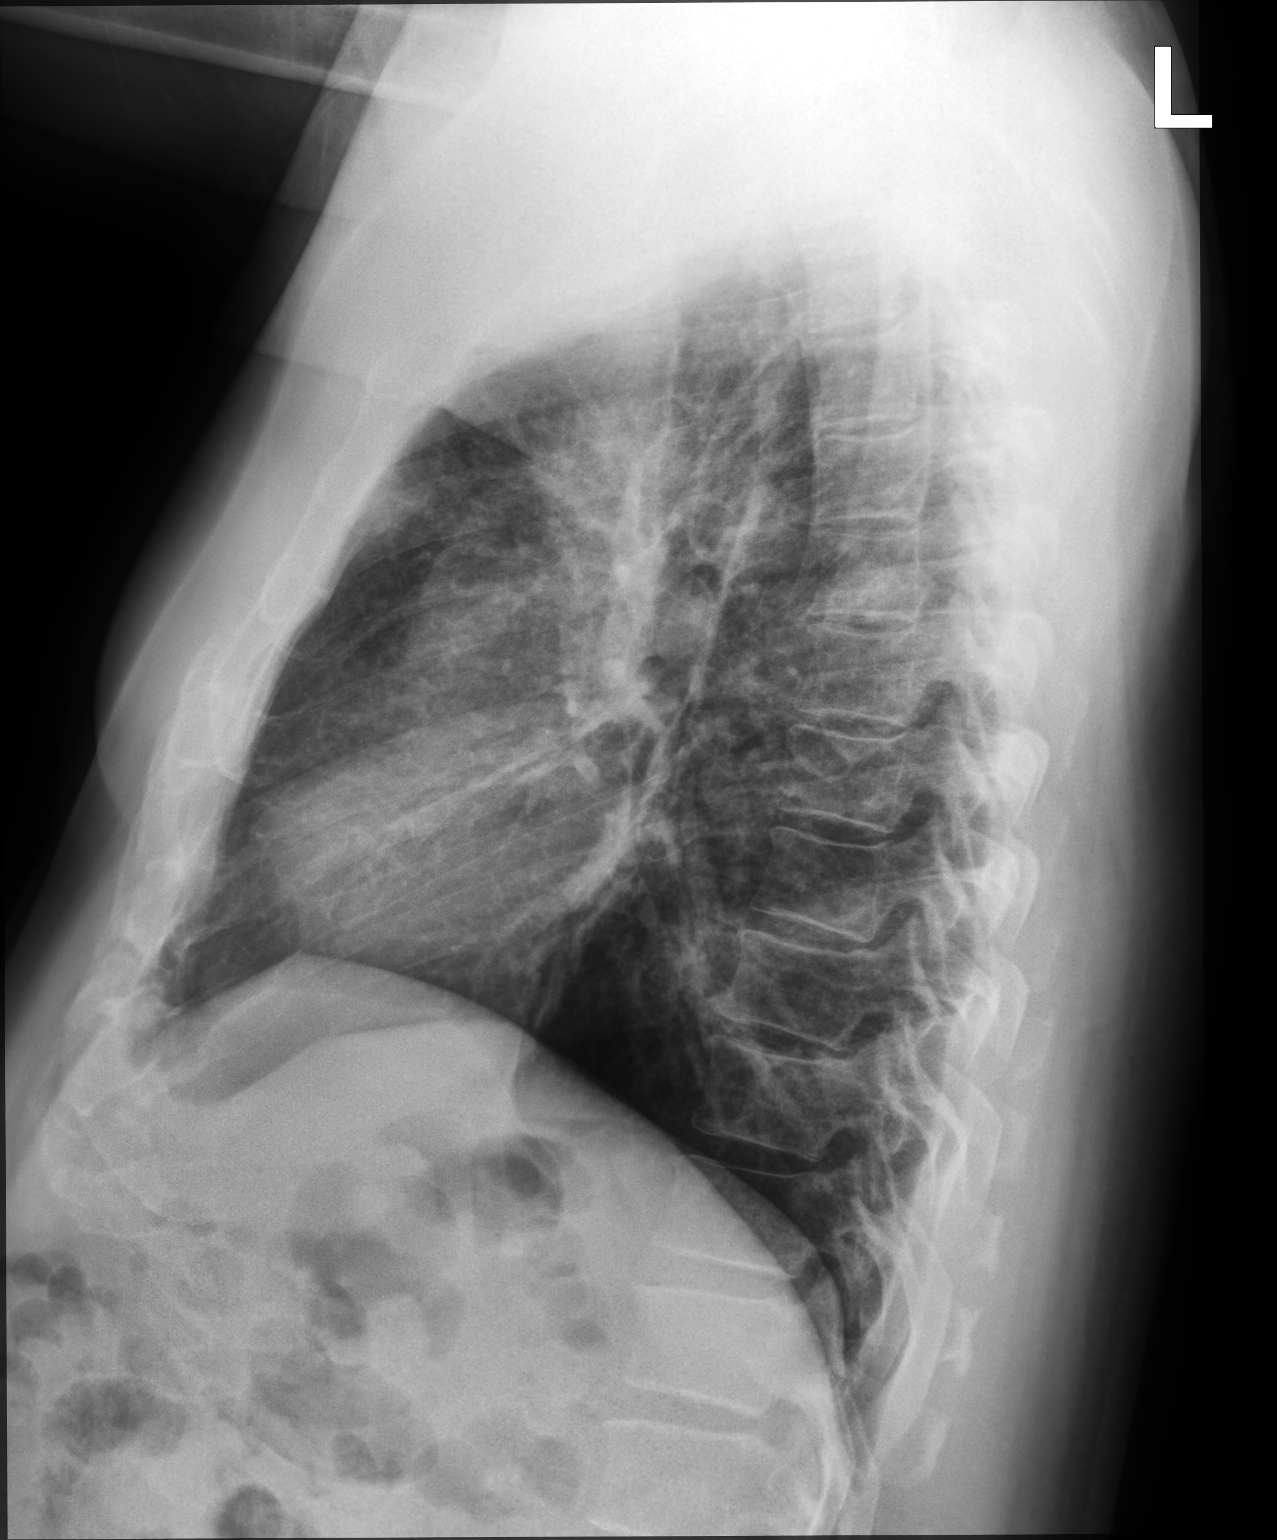

[2 of 2 positions shown; findings below may reference images not displayed]

FINDINGS: Stable cardiac and mediastinal contours. Grossly similar patchy
bilateral airspace opacities. No pleural effusion or pneumothorax.
Thoracic spine degenerative changes.
IMPRESSION: Similar bilateral patchy airspace opacities may represent sequelae
of TBRNY-ID/bilateral pneumonia.

## 2021-04-03 ENCOUNTER — Telehealth: Payer: Self-pay | Admitting: Family Medicine

## 2021-04-03 NOTE — Telephone Encounter (Signed)
Patient called in requesting a phone call back regarding a savings card for Dulaglutide (TRULICITY) 6.43 IP/7.7PZ SOPN [[id number]]  medication.  ? ?Please advise. ?

## 2021-04-04 NOTE — Telephone Encounter (Signed)
I spoke with the pt and he sated that he is paying full price for medication and stated he would discuss during next visit with PCP ?

## 2021-05-12 ENCOUNTER — Encounter: Payer: Self-pay | Admitting: Family Medicine

## 2021-05-12 ENCOUNTER — Ambulatory Visit (INDEPENDENT_AMBULATORY_CARE_PROVIDER_SITE_OTHER): Payer: BC Managed Care – PPO | Admitting: Family Medicine

## 2021-05-12 VITALS — BP 142/88 | HR 91 | Temp 97.9°F | Ht 72.0 in | Wt 198.6 lb

## 2021-05-12 DIAGNOSIS — E11319 Type 2 diabetes mellitus with unspecified diabetic retinopathy without macular edema: Secondary | ICD-10-CM

## 2021-05-12 DIAGNOSIS — R030 Elevated blood-pressure reading, without diagnosis of hypertension: Secondary | ICD-10-CM | POA: Diagnosis not present

## 2021-05-12 DIAGNOSIS — E785 Hyperlipidemia, unspecified: Secondary | ICD-10-CM | POA: Diagnosis not present

## 2021-05-12 LAB — POCT GLYCOSYLATED HEMOGLOBIN (HGB A1C): Hemoglobin A1C: 8.1 % — AB (ref 4.0–5.6)

## 2021-05-12 MED ORDER — TRULICITY 0.75 MG/0.5ML ~~LOC~~ SOAJ: SUBCUTANEOUS | 3 refills | Status: DC

## 2021-05-12 MED ORDER — METFORMIN HCL ER 750 MG PO TB24: 750.0000 mg | ORAL_TABLET | Freq: Every day | ORAL | 3 refills | Status: DC

## 2021-05-12 NOTE — Progress Notes (Signed)
? ?Established Patient Office Visit ? ?Subjective   ?Patient ID: Todd English, male    DOB: February 20, 1970  Age: 51 y.o. MRN: [id number] ? ?Chief Complaint  ?Patient presents with  ? Follow-up  ? ? ?HPI ? ? ?Todd English is seen for medical follow-up.  He actually ran out of Trulicity and metformin couple months ago.  He states that when he was taking the metformin 1000 mg twice daily he was having frequent abdominal cramps and he actually stopped this on his own couple months ago.  He does need refills of Trulicity.  He has not tried extended release metformin in the past.  He remains on simvastatin and [place].  Not monitoring blood sugars regularly.  Denies any myalgias with the simvastatin.  Last LDL cholesterol 114.  No history of hypertension.  Blood pressure is up today.  He states over the weekend he had 2 birthday parties and ate perhaps a lot more sodium than usual.  No regular alcohol use. ? ?Past Medical History:  ?Diagnosis Date  ? Allergy   ? Depression   ? Patient states he does not have depression.  ? Diabetes mellitus   ? GERD (gastroesophageal reflux disease)   ? Patient states he does not have GERD  ? Hyperlipidemia   ? ?Past Surgical History:  ?Procedure Laterality Date  ? no surgical hx    ? ? reports that he quit smoking about 11 years ago. His smoking use included cigarettes. He has a 15.00 pack-year smoking history. He has never used smokeless tobacco. He reports current alcohol use of about 2.0 standard drinks per week. He reports that he does not use drugs. ?family history includes Diabetes Mellitus II in his maternal grandmother. ?No Known Allergies ? ?Review of Systems  ?Constitutional:  Negative for weight loss.  ?Respiratory:  Negative for cough.   ?Cardiovascular:  Negative for chest pain.  ?Musculoskeletal:  Negative for myalgias.  ?Neurological:  Negative for headaches.  ?Endo/Heme/Allergies:  Negative for polydipsia.  ? ?  ?Objective:  ?  ? ?BP (!) 142/88 (BP Location: Left Arm, Patient  Position: Sitting, Cuff Size: Normal)   Pulse 91   Temp 97.9 ?F (36.6 ?C) (Oral)   Ht 6' (1.829 m)   Wt 198 lb 9.6 oz (90.1 kg)   SpO2 99%   BMI 26.94 kg/m?  ? ? ?Physical Exam ?Vitals reviewed.  ?Constitutional:   ?   Appearance: Normal appearance.  ?Cardiovascular:  ?   Rate and Rhythm: Normal rate and regular rhythm.  ?Pulmonary:  ?   Effort: Pulmonary effort is normal.  ?   Breath sounds: Normal breath sounds.  ?Musculoskeletal:  ?   Right lower leg: No edema.  ?   Left lower leg: No edema.  ?Neurological:  ?   Mental Status: He is alert.  ? ? ? ?Results for orders placed or performed in visit on 05/12/21  ?POCT glycosylated hemoglobin (Hb A1C)  ?Result Value Ref Range  ? Hemoglobin A1C 8.1 (A) 4.0 - 5.6 %  ? HbA1c POC (<> result, manual entry)    ? HbA1c, POC (prediabetic range)    ? HbA1c, POC (controlled diabetic range)    ? ? ? ? ?The 10-year ASCVD risk score (Arnett DK, et al., 2019) is: 12.3% ? ?  ?Assessment & Plan:  ? ?#1 type 2 diabetes poorly controlled with A1c today 8.1%.  This is up from previous value of 6.4% when he was taking all of his medications regularly.  This likely  reflects him being out of metformin for couple months and out of Trulicity for almost 2 months. ?-Get back on Trulicity 6.56 mg subcutaneous once weekly with new prescription sent ?-Try extended release metformin with his prior history abdominal cramps with immediate release.  We will try metformin XR 750 mg once daily ?-Set up 5-monthfollow-up and recheck A1c then ? ?#2 elevated blood pressure.  No prior history of hypertension.  He has home cuff.  Recommend frequent monitoring over the next few weeks.  Handout on DASH diet given.  Be in touch if consistently greater than 140/90.  Goal less than 130/80. ? ?#3 hyperlipidemia.  Patient on simvastatin.  Continue simvastatin 40 mg daily.  With next lipids if still up above 100 consider change to high-dose statin with possibly Lipitor or Crestor higher dose ? ? ?Return in  about 3 months (around 08/12/2021).  ? ? ?BCarolann Littler MD ? ?

## 2021-05-12 NOTE — Patient Instructions (Signed)
Monitor blood pressure and be in touch if consisently > 140/90.    ? ?Keep sodium intake daily < 2,500 mg  ?

## 2021-05-19 ENCOUNTER — Ambulatory Visit: Payer: BC Managed Care – PPO | Admitting: Family Medicine

## 2021-05-20 ENCOUNTER — Telehealth: Payer: Self-pay | Admitting: Family Medicine

## 2021-05-20 NOTE — Telephone Encounter (Signed)
Pt states the insurance company will not cover the dosage of  Dulaglutide (TRULICITY) 6.24 EC/9.5QH SOPN wants him to change to another brand. Please contact patient with an update. Last seen on 05/12/21 ?

## 2021-05-20 NOTE — Telephone Encounter (Signed)
Pt states at last appointment he was told he would get refills. he is out of FARXIGA 5 MG TABS tablet by Thursday, has no metFORMIN (GLUCOPHAGE-XR) 750 MG 24 hr tablet or simvastatin (ZOCOR) 40 MG tablet ?

## 2021-05-21 MED ORDER — DAPAGLIFLOZIN PROPANEDIOL 5 MG PO TABS: 5.0000 mg | ORAL_TABLET | Freq: Every day | ORAL | 5 refills | Status: DC

## 2021-05-21 MED ORDER — SIMVASTATIN 40 MG PO TABS: 40.0000 mg | ORAL_TABLET | Freq: Every day | ORAL | 0 refills | Status: DC

## 2021-05-21 NOTE — Telephone Encounter (Signed)
Rx sent 

## 2021-05-22 MED ORDER — OZEMPIC (0.25 OR 0.5 MG/DOSE) 2 MG/1.5ML ~~LOC~~ SOPN: PEN_INJECTOR | SUBCUTANEOUS | 0 refills | Status: DC

## 2021-05-22 NOTE — Addendum Note (Signed)
Addended by: Rosalee Kaufman L on: 05/22/2021 12:12 PM   Modules accepted: Orders

## 2021-05-22 NOTE — Telephone Encounter (Signed)
I spoke with the pt and he stated his insurance is willing to cover Ozempic and he requesting rx be sent to pharmacy.

## 2021-05-22 NOTE — Telephone Encounter (Signed)
Left a detailed message informing the pt that rx was sent and to follow up in 1 month to reassess.

## 2021-05-23 NOTE — Telephone Encounter (Signed)
I spoke with the pt and he reported that Ozempic has a cost of 180 dollars and he is unable to pay that price for rx. Pt spoke with his insurance and they stated they will cover Victoza and requested that rx be sent in. Please advise

## 2021-05-26 MED ORDER — VICTOZA 18 MG/3ML ~~LOC~~ SOPN: PEN_INJECTOR | SUBCUTANEOUS | 0 refills | Status: DC

## 2021-05-26 NOTE — Telephone Encounter (Signed)
Rx sent and pt aware 

## 2021-05-26 NOTE — Addendum Note (Signed)
Addended by: Nilda Riggs on: 05/26/2021 08:02 AM   Modules accepted: Orders

## 2021-05-26 NOTE — Addendum Note (Signed)
Addended by: Nilda Riggs on: 05/26/2021 08:36 AM   Modules accepted: Orders

## 2021-07-23 ENCOUNTER — Telehealth: Payer: Self-pay | Admitting: Family Medicine

## 2021-07-23 MED ORDER — OZEMPIC (0.25 OR 0.5 MG/DOSE) 2 MG/1.5ML ~~LOC~~ SOPN: PEN_INJECTOR | SUBCUTANEOUS | 0 refills | Status: DC

## 2021-07-23 NOTE — Telephone Encounter (Signed)
Rx sent 

## 2021-07-23 NOTE — Telephone Encounter (Signed)
Pt requests refill of Semaglutide,0.25 or 0.'5MG'$ /DOS, (OZEMPIC, 0.25 OR 0.5 MG/DOSE,) 2 MG/1.5ML SOPN   [id number] [facility] [address]:  [phone] Fax:  [phone]

## 2021-08-12 ENCOUNTER — Ambulatory Visit (INDEPENDENT_AMBULATORY_CARE_PROVIDER_SITE_OTHER): Payer: BC Managed Care – PPO | Admitting: Family Medicine

## 2021-08-12 ENCOUNTER — Encounter: Payer: Self-pay | Admitting: Family Medicine

## 2021-08-12 VITALS — BP 128/88 | HR 82 | Temp 98.2°F | Ht 72.0 in | Wt 199.4 lb

## 2021-08-12 DIAGNOSIS — E11319 Type 2 diabetes mellitus with unspecified diabetic retinopathy without macular edema: Secondary | ICD-10-CM | POA: Diagnosis not present

## 2021-08-12 LAB — POCT GLYCOSYLATED HEMOGLOBIN (HGB A1C): Hemoglobin A1C: 7.8 % — AB (ref 4.0–5.6)

## 2021-08-12 MED ORDER — OZEMPIC (0.25 OR 0.5 MG/DOSE) 2 MG/3ML ~~LOC~~ SOPN: 0.5000 mg | PEN_INJECTOR | SUBCUTANEOUS | 5 refills | Status: DC

## 2021-08-12 NOTE — Patient Instructions (Signed)
Increase the Ozempic to 0.5 mg [place] once weekly  Let's plan on 3 month follow up.

## 2021-08-12 NOTE — Progress Notes (Signed)
Established Patient Office Visit  Subjective   Patient ID: Todd English, male    DOB: [birth year]  Age: 51 y.o. MRN: [id number]  Chief Complaint  Patient presents with   Follow-up    HPI   Here for follow-up type 2 diabetes.  He is currently taking [place] and was started just couple months ago on Ozempic 0.25 mg subcutaneous once weekly.  Tolerating well with no side effects.  Previous to that he was on Trulicity.  Not checking blood sugars regularly at home.  Last A1c was 8.1%.  Home blood pressures usually around 494 systolic and 80 diastolic.  He takes simvastatin for hyperlipidemia.  Is also taking metformin for his diabetes.  Last lipids were checked in November of last year.  Past Medical History:  Diagnosis Date   Allergy    Depression    Patient states he does not have depression.   Diabetes mellitus    GERD (gastroesophageal reflux disease)    Patient states he does not have GERD   Hyperlipidemia    Past Surgical History:  Procedure Laterality Date   no surgical hx      reports that he quit smoking about 11 years ago. His smoking use included cigarettes. He has a 15.00 pack-year smoking history. He has never used smokeless tobacco. He reports current alcohol use of about 2.0 standard drinks of alcohol per week. He reports that he does not use drugs. family history includes Diabetes Mellitus II in his maternal grandmother. No Known Allergies  Review of Systems  Constitutional:  Negative for malaise/fatigue.  Eyes:  Negative for blurred vision.  Respiratory:  Negative for shortness of breath.   Cardiovascular:  Negative for chest pain.  Neurological:  Negative for dizziness, weakness and headaches.      Objective:     BP 128/88 (BP Location: Left Arm, Cuff Size: Normal)   Pulse 82   Temp 98.2 F (36.8 C) (Oral)   Ht 6' (1.829 m)   Wt 199 lb 6.4 oz (90.4 kg)   SpO2 100%   BMI 27.04 kg/m    Physical Exam Constitutional:      Appearance: He is  well-developed.  HENT:     Right Ear: External ear normal.     Left Ear: External ear normal.  Eyes:     Pupils: Pupils are equal, round, and reactive to light.  Neck:     Thyroid: No thyromegaly.  Cardiovascular:     Rate and Rhythm: Normal rate and regular rhythm.  Pulmonary:     Effort: Pulmonary effort is normal. No respiratory distress.     Breath sounds: Normal breath sounds. No wheezing or rales.  Musculoskeletal:     Cervical back: Neck supple.     Right lower leg: No edema.     Left lower leg: No edema.  Neurological:     Mental Status: He is alert and oriented to person, place, and time.      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  Titrate Ozempic to 0.5 mg subcutaneous once weekly.  Step up exercise.  Continue low glycemic diet.  Set up 59-monthfollow-up.  Plan lipids and consider PSA testing at 311-monthollow-up.  Blood pressure was borderline up today diastolic and we recommend some continued weight loss with Ozempic and reassess at follow-up.  Will also need urine microalbumin at follow-up  No follow-ups on file.    BrCarolann LittlerMD

## 2021-11-12 ENCOUNTER — Encounter: Payer: Self-pay | Admitting: Family Medicine

## 2021-11-12 ENCOUNTER — Ambulatory Visit (INDEPENDENT_AMBULATORY_CARE_PROVIDER_SITE_OTHER): Payer: BC Managed Care – PPO | Admitting: Family Medicine

## 2021-11-12 VITALS — BP 142/88 | HR 80 | Temp 97.8°F | Ht 72.0 in | Wt 192.7 lb

## 2021-11-12 DIAGNOSIS — E11319 Type 2 diabetes mellitus with unspecified diabetic retinopathy without macular edema: Secondary | ICD-10-CM

## 2021-11-12 DIAGNOSIS — E785 Hyperlipidemia, unspecified: Secondary | ICD-10-CM | POA: Diagnosis not present

## 2021-11-12 DIAGNOSIS — R030 Elevated blood-pressure reading, without diagnosis of hypertension: Secondary | ICD-10-CM | POA: Diagnosis not present

## 2021-11-12 DIAGNOSIS — Z125 Encounter for screening for malignant neoplasm of prostate: Secondary | ICD-10-CM

## 2021-11-12 LAB — MICROALBUMIN / CREATININE URINE RATIO
Creatinine,U: 92.2 mg/dL
Microalb Creat Ratio: 1.1 mg/g (ref 0.0–30.0)
Microalb, Ur: 1 mg/dL (ref 0.0–1.9)

## 2021-11-12 LAB — COMPREHENSIVE METABOLIC PANEL
ALT: 16 U/L (ref 0–53)
AST: 14 U/L (ref 0–37)
Albumin: 4.4 g/dL (ref 3.5–5.2)
Alkaline Phosphatase: 76 U/L (ref 39–117)
BUN: 13 mg/dL (ref 6–23)
CO2: 30 mEq/L (ref 19–32)
Calcium: 9.6 mg/dL (ref 8.4–10.5)
Chloride: 101 mEq/L (ref 96–112)
Creatinine, Ser: 1.13 mg/dL (ref 0.40–1.50)
GFR: 74.32 mL/min (ref >60.00)
Glucose, Bld: 127 mg/dL — ABNORMAL HIGH (ref 70–99)
Potassium: 4.5 mEq/L (ref 3.5–5.1)
Sodium: 139 mEq/L (ref 135–145)
Total Bilirubin: 0.5 mg/dL (ref 0.2–1.2)
Total Protein: 6.7 g/dL (ref 6.0–8.3)

## 2021-11-12 LAB — POCT GLYCOSYLATED HEMOGLOBIN (HGB A1C): Hemoglobin A1C: 6.9 % — AB (ref 4.0–5.6)

## 2021-11-12 LAB — LIPID PANEL
Cholesterol: 137 mg/dL (ref 0–200)
HDL: 61.8 mg/dL (ref >39.00)
LDL Cholesterol: 58 mg/dL (ref 0–99)
NonHDL: 74.82
Total CHOL/HDL Ratio: 2
Triglycerides: 83 mg/dL (ref 0.0–149.0)
VLDL: 16.6 mg/dL (ref 0.0–40.0)

## 2021-11-12 LAB — PSA: PSA: 0.48 ng/mL (ref 0.10–4.00)

## 2021-11-12 NOTE — Patient Instructions (Signed)
Monitor blood pressure at home and be in touch if consistently > 140/90. 

## 2021-11-12 NOTE — Progress Notes (Signed)
Established Patient Office Visit  Subjective   Patient ID: Todd English, male    DOB: 10/21/1970  Age: 51 y.o. MRN: [id number]  Chief Complaint  Patient presents with   Follow-up    HPI   Todd English is seen for follow-up regarding type 2 diabetes.  He had very stressful past couple months.  He had 2 uncles and 1 cousin died during this time.  His last A1c was 7.8%.  We started Ozempic and he is currently on 0.5 mg subcutaneous once weekly.  Tolerating well.  Has lost about 7 pounds.  No hypoglycemia. Also takes metformin and [place].  He has hyperlipidemia treated with simvastatin 40 mg daily.  Been on this for several years and tolerating well.  Blood pressure slightly up today and was up slightly last time.  He does have home monitor but has not been monitoring recently.  Past Medical History:  Diagnosis Date   Allergy    Depression    Patient states he does not have depression.   Diabetes mellitus    GERD (gastroesophageal reflux disease)    Patient states he does not have GERD   Hyperlipidemia    Past Surgical History:  Procedure Laterality Date   no surgical hx      reports that he quit smoking about 11 years ago. His smoking use included cigarettes. He has a 15.00 pack-year smoking history. He has never used smokeless tobacco. He reports current alcohol use of about 2.0 standard drinks of alcohol per week. He reports that he does not use drugs. family history includes Diabetes Mellitus II in his maternal grandmother. No Known Allergies  Review of Systems  Constitutional:  Negative for malaise/fatigue.  Eyes:  Negative for blurred vision.  Respiratory:  Negative for shortness of breath.   Cardiovascular:  Negative for chest pain.  Gastrointestinal:  Negative for abdominal pain, nausea and vomiting.  Neurological:  Negative for dizziness, weakness and headaches.      Objective:     BP (!) 142/88 (BP Location: Left Arm, Cuff Size: Normal)   Pulse 80   Temp  97.8 F (36.6 C) (Oral)   Ht 6' (1.829 m)   Wt 192 lb 11.2 oz (87.4 kg)   SpO2 100%   BMI 26.13 kg/m       Results for orders placed or performed in visit on 11/12/21  POCT glycosylated hemoglobin (Hb A1C)  Result Value Ref Range   Hemoglobin A1C 6.9 (A) 4.0 - 5.6 %   HbA1c POC (<> result, manual entry)     HbA1c, POC (prediabetic range)     HbA1c, POC (controlled diabetic range)        The 10-year ASCVD risk score (Arnett DK, et al., 2019) is: 12.8%    Assessment & Plan:   #1 type 2 diabetes improved with A1c 6.9% after recent addition of Ozempic.  We decided to keep current dose of 0.5 mg subcutaneous once weekly and continue other diabetic medications.  Continue diabetic eye exam yearly.  Check urine microalbumin screen today.  #2 hyperlipidemia treated with simvastatin.  Recheck lipid and CMP  #3 elevated blood pressure reading.  Is had a couple back-to-back readings.  Some improvement after rest.  Discussed options.  We discussed consideration for ACE versus ARB but he would like to monitor more closely at home for the next several weeks.  Be in touch if consistently greater than 140/90.  #4 prostate cancer screening.  No recent PSA.  Check PSA today  Recommend 27-monthfollow-up   Return in about 3 months (around 02/12/2022).    BCarolann Littler MD

## 2021-11-26 ENCOUNTER — Telehealth: Payer: Self-pay | Admitting: Family Medicine

## 2021-11-26 MED ORDER — DAPAGLIFLOZIN PROPANEDIOL 5 MG PO TABS: 5.0000 mg | ORAL_TABLET | Freq: Every day | ORAL | 5 refills | Status: DC

## 2021-11-26 NOTE — Telephone Encounter (Signed)
Requesting refill dapagliflozin propanediol (FARXIGA) 5 MG TABS tablet  [id number] [place] [address] Phone: [phone] Fax: [phone]

## 2021-11-26 NOTE — Telephone Encounter (Signed)
Rx sent 

## 2022-01-22 ENCOUNTER — Telehealth: Payer: Self-pay | Admitting: Family Medicine

## 2022-01-22 NOTE — Telephone Encounter (Signed)
Pt called to say he has been out of the Ozempic for 2 wks. This is because his pharmacy is on back order. Pt is asking if we have any samples that he can come in and pick up?  Please advise.    [phone]

## 2022-01-22 NOTE — Telephone Encounter (Signed)
I spoke with the patient and informed him we do not have any sample currently available out our office. Patient inquired of alternative medication can be sent in due to Ozempic being on back order. Patient aware PCP is out of office this week

## 2022-01-23 NOTE — Telephone Encounter (Signed)
Patient informed of the message below and verbalized understanding.  

## 2022-01-26 ENCOUNTER — Other Ambulatory Visit (HOSPITAL_BASED_OUTPATIENT_CLINIC_OR_DEPARTMENT_OTHER): Payer: Self-pay

## 2022-01-26 MED ORDER — OZEMPIC (0.25 OR 0.5 MG/DOSE) 2 MG/3ML ~~LOC~~ SOPN
0.5000 mg | PEN_INJECTOR | SUBCUTANEOUS | 5 refills | Status: DC
  Filled 2022-01-26 – 2022-04-04 (×5): qty 3, 28d supply, fill #0
  Filled 2022-04-04: qty 3, 30d supply, fill #0
  Filled 2022-04-07: qty 3, 28d supply, fill #0

## 2022-01-26 NOTE — Telephone Encounter (Signed)
Rx sent 

## 2022-01-26 NOTE — Telephone Encounter (Signed)
Pt called to ask if his Ozempic can be sent to    [place] Phone: [phone]  Fax: [phone]      Please advise.

## 2022-01-26 NOTE — Addendum Note (Signed)
Addended by: Nilda Riggs on: 01/26/2022 11:41 AM   Modules accepted: Orders

## 2022-01-27 ENCOUNTER — Other Ambulatory Visit (HOSPITAL_BASED_OUTPATIENT_CLINIC_OR_DEPARTMENT_OTHER): Payer: Self-pay

## 2022-01-29 ENCOUNTER — Other Ambulatory Visit (HOSPITAL_BASED_OUTPATIENT_CLINIC_OR_DEPARTMENT_OTHER): Payer: Self-pay

## 2022-01-30 ENCOUNTER — Telehealth: Payer: Self-pay | Admitting: Family Medicine

## 2022-01-30 DIAGNOSIS — E1169 Type 2 diabetes mellitus with other specified complication: Secondary | ICD-10-CM

## 2022-01-30 NOTE — Telephone Encounter (Signed)
Left a message for the patient to return my call.  °

## 2022-01-30 NOTE — Telephone Encounter (Signed)
Patient states the cost of Semaglutide,0.25 or 0.'5MG'$ /DOS, (OZEMPIC, 0.25 OR 0.5 MG/DOSE,) 2 MG/3ML SOPN and dapagliflozin propanediol (FARXIGA) 5 MG TABS tablet are very expensive and is seeking samples or a comparable cost effective substitution.

## 2022-02-03 NOTE — Telephone Encounter (Signed)
Left a message for the patient to return my call.  °

## 2022-02-05 NOTE — Telephone Encounter (Signed)
I spoke with the patient and informed him of the message below. Patient inquired of Rybelsus can be sent in to his pharmacy as he states he can get this medication at a lower cost.

## 2022-02-06 ENCOUNTER — Other Ambulatory Visit: Payer: Self-pay

## 2022-02-06 ENCOUNTER — Other Ambulatory Visit (HOSPITAL_BASED_OUTPATIENT_CLINIC_OR_DEPARTMENT_OTHER): Payer: Self-pay

## 2022-02-06 MED ORDER — RYBELSUS 3 MG PO TABS
3.0000 mg | ORAL_TABLET | Freq: Every day | ORAL | 0 refills | Status: DC
  Filled 2022-02-06 – 2022-02-07 (×3): qty 30, 30d supply, fill #0

## 2022-02-06 MED ORDER — RYBELSUS 3 MG PO TABS: 3.0000 mg | ORAL_TABLET | Freq: Every day | ORAL | 0 refills | Status: DC

## 2022-02-06 NOTE — Addendum Note (Signed)
Addended by: Nilda Riggs on: 02/06/2022 08:25 AM   Modules accepted: Orders

## 2022-02-06 NOTE — Telephone Encounter (Signed)
Rx sent and patient informed of the message below. Patient request a referral to Cardiology as he states he would like their opinion regarding his diabetes and risk for CAD.

## 2022-02-06 NOTE — Telephone Encounter (Signed)
The 10-year ASCVD risk score (Arnett DK, et al., 2019) is: 11.4%   Values used to calculate the score:     Age: 52 years     Sex: Male     Is Non-Hispanic African American: Yes     Diabetic: Yes     Tobacco smoker: No     Systolic Blood Pressure: 747 mmHg     Is BP treated: No     HDL Cholesterol: 61.8 mg/dL     Total Cholesterol: 137 mg/dL   Patient would like to consider further coronary disease restratification.  Will set up coronary calcium score.  Let him know I have already placed the order for this and he should receive a call  [name] MD [facility]

## 2022-02-06 NOTE — Addendum Note (Signed)
Addended by: [name] on: 02/06/2022 01:09 PM   Modules accepted: Orders

## 2022-02-06 NOTE — Telephone Encounter (Signed)
I spoke with the patient and he agreed to have Coronary calcium score done.

## 2022-02-07 ENCOUNTER — Other Ambulatory Visit (HOSPITAL_BASED_OUTPATIENT_CLINIC_OR_DEPARTMENT_OTHER): Payer: Self-pay

## 2022-02-11 NOTE — Telephone Encounter (Signed)
Patient informed of the message below and verbalized understanding.  

## 2022-02-11 NOTE — Telephone Encounter (Signed)
Pt is returning mykal call 

## 2022-02-12 ENCOUNTER — Encounter: Payer: Self-pay | Admitting: Family Medicine

## 2022-02-13 ENCOUNTER — Telehealth: Payer: Self-pay | Admitting: Family Medicine

## 2022-02-13 ENCOUNTER — Encounter: Payer: Self-pay | Admitting: Family Medicine

## 2022-02-13 ENCOUNTER — Other Ambulatory Visit (HOSPITAL_BASED_OUTPATIENT_CLINIC_OR_DEPARTMENT_OTHER): Payer: Self-pay

## 2022-02-13 ENCOUNTER — Ambulatory Visit (INDEPENDENT_AMBULATORY_CARE_PROVIDER_SITE_OTHER): Payer: BC Managed Care – PPO | Admitting: Family Medicine

## 2022-02-13 VITALS — BP 134/80 | HR 70 | Temp 98.3°F | Ht 72.0 in | Wt 193.9 lb

## 2022-02-13 DIAGNOSIS — E11319 Type 2 diabetes mellitus with unspecified diabetic retinopathy without macular edema: Secondary | ICD-10-CM

## 2022-02-13 LAB — POCT GLYCOSYLATED HEMOGLOBIN (HGB A1C): Hemoglobin A1C: 7.5 % — AB (ref 4.0–5.6)

## 2022-02-13 NOTE — Patient Instructions (Signed)
We will try to get the Ozempic and [place] approved by insurance.    Let's plan on 4 month follow up.

## 2022-02-13 NOTE — Telephone Encounter (Addendum)
PA for Ozempic has been approved from 02/13/2022-02/12/2023 Key: [facility]. PA for Farxiga per Covermymeds states no PA is needed for this medication Key: [id number]. Patient is aware of this

## 2022-02-13 NOTE — Progress Notes (Signed)
Established Patient Office Visit  Subjective   Patient ID: Todd English, male    DOB: [birth year]  Age: 52 y.o. MRN: [id number]  No chief complaint on file.   HPI   Todd English is seen for medical follow-up.  Type 2 diabetes and hyperlipidemia history.  His last A1c was 6.9%.  He was doing very well on regimen of metformin, [place], and Ozempic but unfortunately his insurance did not cover the [place] or [place] and we were not notified that this needed a prior authorization.  For the past month he has been out of those 2 medications.  He previously took glimepiride but this is not controlling his blood sugars well in combination with metformin.  He has been very diligent with exercise and diet.  Basically following vegetarian diet.  Blood pressure is also improved with his weight loss.  Recent lipids were well-controlled.  Past Medical History:  Diagnosis Date   Allergy    Depression    Patient states he does not have depression.   Diabetes mellitus    GERD (gastroesophageal reflux disease)    Patient states he does not have GERD   Hyperlipidemia    Past Surgical History:  Procedure Laterality Date   no surgical hx      reports that he quit smoking about 12 years ago. His smoking use included cigarettes. He has a 15.00 pack-year smoking history. He has never used smokeless tobacco. He reports current alcohol use of about 2.0 standard drinks of alcohol per week. He reports that he does not use drugs. family history includes Diabetes Mellitus II in his maternal grandmother. No Known Allergies  Review of Systems  Constitutional:  Negative for malaise/fatigue.  Eyes:  Negative for blurred vision.  Respiratory:  Negative for shortness of breath.   Cardiovascular:  Negative for chest pain.  Neurological:  Negative for dizziness, weakness and headaches.      Objective:     BP 134/80 (BP Location: Left Arm, Patient Position: Sitting, Cuff Size: Normal)   Pulse 70   Temp 98.3  F (36.8 C) (Oral)   Ht 6' (1.829 m)   Wt 193 lb 14.4 oz (88 kg)   SpO2 100%   BMI 26.30 kg/m    Physical Exam Constitutional:      Appearance: He is well-developed.  HENT:     Right Ear: External ear normal.     Left Ear: External ear normal.  Eyes:     Pupils: Pupils are equal, round, and reactive to light.  Neck:     Thyroid: No thyromegaly.  Cardiovascular:     Rate and Rhythm: Normal rate and regular rhythm.  Pulmonary:     Effort: Pulmonary effort is normal. No respiratory distress.     Breath sounds: Normal breath sounds. No wheezing or rales.  Musculoskeletal:     Cervical back: Neck supple.  Neurological:     Mental Status: He is alert and oriented to person, place, and time.      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  This was 6.9% previously.  Will work on prior authorization for [employer].  Continue healthy lifestyle changes.  Set up 66-monthfollow-up.  Return in about 4 months (around 06/14/2022).    BCarolann Littler MD

## 2022-02-13 NOTE — Telephone Encounter (Signed)
Pt is calling and  per pt there is  gender discrepancy. Pt is list as male not male please call pharm to clarify [place] Phone: [phone]  Fax: [phone]

## 2022-02-19 ENCOUNTER — Other Ambulatory Visit (HOSPITAL_BASED_OUTPATIENT_CLINIC_OR_DEPARTMENT_OTHER): Payer: Self-pay

## 2022-02-23 ENCOUNTER — Telehealth: Payer: Self-pay

## 2022-02-23 ENCOUNTER — Encounter (HOSPITAL_BASED_OUTPATIENT_CLINIC_OR_DEPARTMENT_OTHER): Payer: Self-pay

## 2022-02-23 ENCOUNTER — Other Ambulatory Visit (HOSPITAL_BASED_OUTPATIENT_CLINIC_OR_DEPARTMENT_OTHER): Payer: Self-pay

## 2022-02-23 NOTE — Telephone Encounter (Signed)
-----   Message from [name], MD sent at 02/20/2022  6:56 AM EST ----- Regarding: RE: Ozempic Maybe tier exception through cover my meds?   ----- Message ----- From: Vernell Leep, CPhT Sent: 02/19/2022  10:50 AM EST To: [name], MD; Nilda Riggs, CMA Subject: Ozempic                                        Is there any way that your office could request a tier exception or reduction on [name]'s Ozempic? The representative from [facility] stated that it's a high tier medication and until his combined medical and prescription deductible is met he will have a copay of $949.37 per month. The manufacturer's copay card only covers $150 per 1 month fill, so it will still cost the patient $799.37. I instructed the patient to log into the patient portal for his insurance and check the formulary to see if there is another medication that may have a high coverage rate and to touch base with your office and myself on Monday or Tuesday next week.   Thanks, [employer]

## 2022-02-23 NOTE — Telephone Encounter (Signed)
I spoke with Todd English at [facility] [employer] and he stated that the patient is not eligible for tier exemption due to the patient having commercial insurance and not medicare.

## 2022-02-24 ENCOUNTER — Other Ambulatory Visit (HOSPITAL_BASED_OUTPATIENT_CLINIC_OR_DEPARTMENT_OTHER): Payer: Self-pay

## 2022-02-26 ENCOUNTER — Other Ambulatory Visit (HOSPITAL_BASED_OUTPATIENT_CLINIC_OR_DEPARTMENT_OTHER): Payer: Self-pay

## 2022-02-27 NOTE — Telephone Encounter (Signed)
I spoke with the patient and advised him to contact his insurance and see if they are willing to cover any alternative medication at lower cost. Patient verbalized understanding

## 2022-02-27 NOTE — Telephone Encounter (Signed)
Pt called to speak to [place] regarding the Ozempic.   CMA was with a patient. Pt asked if CMA could call him back or speak to his wife if he is not available?

## 2022-03-12 ENCOUNTER — Other Ambulatory Visit (HOSPITAL_BASED_OUTPATIENT_CLINIC_OR_DEPARTMENT_OTHER): Payer: BC Managed Care – PPO

## 2022-03-13 ENCOUNTER — Ambulatory Visit (HOSPITAL_BASED_OUTPATIENT_CLINIC_OR_DEPARTMENT_OTHER)
Admission: RE | Admit: 2022-03-13 | Discharge: 2022-03-13 | Disposition: A | Discharge status: Home / Self Care | Payer: BC Managed Care – PPO | Source: Ambulatory Visit | Attending: Family Medicine | Admitting: Family Medicine

## 2022-03-13 DIAGNOSIS — E785 Hyperlipidemia, unspecified: Secondary | ICD-10-CM | POA: Insufficient documentation

## 2022-03-13 DIAGNOSIS — E1169 Type 2 diabetes mellitus with other specified complication: Secondary | ICD-10-CM | POA: Insufficient documentation

## 2022-04-03 ENCOUNTER — Other Ambulatory Visit (HOSPITAL_BASED_OUTPATIENT_CLINIC_OR_DEPARTMENT_OTHER): Payer: Self-pay

## 2022-04-04 ENCOUNTER — Other Ambulatory Visit (HOSPITAL_BASED_OUTPATIENT_CLINIC_OR_DEPARTMENT_OTHER): Payer: Self-pay

## 2022-04-07 ENCOUNTER — Other Ambulatory Visit: Payer: Self-pay

## 2022-04-07 NOTE — Telephone Encounter (Signed)
I spoke with Marjory Lies at [facility] and issue has been resolved

## 2022-04-09 DIAGNOSIS — E113293 Type 2 diabetes mellitus with mild nonproliferative diabetic retinopathy without macular edema, bilateral: Secondary | ICD-10-CM | POA: Diagnosis not present

## 2022-04-09 LAB — HM DIABETES EYE EXAM

## 2022-06-04 ENCOUNTER — Telehealth: Payer: Self-pay | Admitting: Family Medicine

## 2022-06-04 MED ORDER — SIMVASTATIN 40 MG PO TABS: 40.0000 mg | ORAL_TABLET | Freq: Every day | ORAL | 1 refills | Status: DC

## 2022-06-04 MED ORDER — METFORMIN HCL ER 750 MG PO TB24: 750.0000 mg | ORAL_TABLET | Freq: Every day | ORAL | 1 refills | Status: DC

## 2022-06-04 NOTE — Telephone Encounter (Signed)
Prescription Request  06/04/2022  LOV: 02/13/2022  What is the name of the medication or equipment?   metFORMIN (GLUCOPHAGE-XR) 750 MG 24 hr tablet   simvastatin (ZOCOR) 40 MG tablet  Have you contacted your pharmacy to request a refill? No   Pt is aware MD will return to office on 06/08/22.  Which pharmacy would you like this sent to?    [id number] [place] [place] - [address] [place] [address] Phone: [phone] Fax: [phone]  Patient notified that their request is being sent to the clinical staff for review and that they should receive a response within 2 business days.   Please advise at Mobile [phone] (mobile)

## 2022-06-04 NOTE — Telephone Encounter (Signed)
Rx sent 

## 2022-06-19 ENCOUNTER — Encounter: Payer: Self-pay | Admitting: Family Medicine

## 2022-06-19 ENCOUNTER — Ambulatory Visit (INDEPENDENT_AMBULATORY_CARE_PROVIDER_SITE_OTHER): Payer: BC Managed Care – PPO | Admitting: Family Medicine

## 2022-06-19 VITALS — BP 118/78 | HR 90 | Temp 98.3°F | Ht 72.0 in | Wt 196.4 lb

## 2022-06-19 DIAGNOSIS — E11319 Type 2 diabetes mellitus with unspecified diabetic retinopathy without macular edema: Secondary | ICD-10-CM | POA: Diagnosis not present

## 2022-06-19 DIAGNOSIS — Z7985 Long-term (current) use of injectable non-insulin antidiabetic drugs: Secondary | ICD-10-CM

## 2022-06-19 DIAGNOSIS — E1169 Type 2 diabetes mellitus with other specified complication: Secondary | ICD-10-CM

## 2022-06-19 DIAGNOSIS — R030 Elevated blood-pressure reading, without diagnosis of hypertension: Secondary | ICD-10-CM | POA: Diagnosis not present

## 2022-06-19 DIAGNOSIS — E785 Hyperlipidemia, unspecified: Secondary | ICD-10-CM

## 2022-06-19 DIAGNOSIS — Z7984 Long term (current) use of oral hypoglycemic drugs: Secondary | ICD-10-CM

## 2022-06-19 LAB — POCT GLYCOSYLATED HEMOGLOBIN (HGB A1C): Hemoglobin A1C: 9.5 % — AB (ref 4.0–5.6)

## 2022-06-19 MED ORDER — GLIPIZIDE 5 MG PO TABS: 5.0000 mg | ORAL_TABLET | Freq: Two times a day (BID) | ORAL | 5 refills | Status: DC

## 2022-06-19 NOTE — Progress Notes (Signed)
Established Patient Office Visit  Subjective   Patient ID: Todd English, male    DOB: 1970-10-30  Age: 52 y.o. MRN: [id number]  Chief Complaint  Patient presents with   Medical Management of Chronic Issues    HPI   Todd English is seen for medical follow-up.  He has type 2 diabetes and hyperlipidemia.  He is post to be on regimen of metformin, Farxiga, and Ozempic.  He had tremendous difficulty getting coverage for Farxiga and Ozempic and basically has been out of both medications now for several months.  We had people work on prior authorization to no avail.  He is not checking his blood sugars regularly.  His weight is stable.  He remains on simvastatin for hyperlipidemia.  No myalgias.  No recent chest pains.  History of borderline elevated blood pressure in the past.  Initially up today but then repeat after rest was back to normal range.  No recent headaches.  No peripheral edema.  Past Medical History:  Diagnosis Date   Allergy    Depression    Patient states he does not have depression.   Diabetes mellitus    GERD (gastroesophageal reflux disease)    Patient states he does not have GERD   Hyperlipidemia    Past Surgical History:  Procedure Laterality Date   no surgical hx      reports that he quit smoking about 12 years ago. His smoking use included cigarettes. He has a 15.00 pack-year smoking history. He has never used smokeless tobacco. He reports current alcohol use of about 2.0 standard drinks of alcohol per week. He reports that he does not use drugs. family history includes Diabetes Mellitus II in his maternal grandmother. No Known Allergies  Review of Systems  Constitutional:  Negative for malaise/fatigue.  Eyes:  Negative for blurred vision.  Respiratory:  Negative for shortness of breath.   Cardiovascular:  Negative for chest pain.  Neurological:  Negative for dizziness, weakness and headaches.      Objective:     BP 118/78 (BP Location: Left Arm, Cuff  Size: Normal)   Pulse 90   Temp 98.3 F (36.8 C) (Oral)   Ht 6' (1.829 m)   Wt 196 lb 6 oz (89.1 kg)   SpO2 97%   BMI 26.63 kg/m    Physical Exam Vitals reviewed.  Constitutional:      Appearance: Normal appearance. He is well-developed.  Neck:     Thyroid: No thyromegaly.  Cardiovascular:     Rate and Rhythm: Normal rate and regular rhythm.  Pulmonary:     Effort: Pulmonary effort is normal. No respiratory distress.     Breath sounds: Normal breath sounds. No wheezing or rales.  Musculoskeletal:     Cervical back: Neck supple.     Right lower leg: No edema.     Left lower leg: No edema.  Neurological:     Mental Status: He is alert and oriented to person, place, and time.      Results for orders placed or performed in visit on 06/19/22  POC HgB A1c  Result Value Ref Range   Hemoglobin A1C 9.5 (A) 4.0 - 5.6 %   HbA1c POC (<> result, manual entry)     HbA1c, POC (prediabetic range)     HbA1c, POC (controlled diabetic range)        The 10-year ASCVD risk score (Arnett DK, et al., 2019) is: 8.3%    Assessment & Plan:   #1 type  2 diabetes poorly controlled.  Poor compliance with medication secondary to cost.  He is currently not on Farxiga or Ozempic.  When he was on this regimen with metformin his A1c's were well-controlled.  Will continue to work on prior authorization.  In the meantime, start glipizide 5 mg twice daily.  Caution against potential hypoglycemia. Set up 45-month follow-up  #2 hyperlipidemia treated with simvastatin 40 mg daily.  Recheck fasting lipids at follow-up.  He is tolerating simvastatin well without side effects.  Continue current dosage  #3 elevated blood pressure without diagnosis of hypertension.  Repeat after rest improved.  Continue low-sodium diet and moderation in alcohol   Return in about 3 months (around 09/19/2022).    Evelena Peat, MD

## 2022-06-19 NOTE — Patient Instructions (Signed)
A1C today up to 9.5%  Start the Glipizide 5 mg twice daily  Set up 3 month follow up  Hopefully we can still get the [place] and Ozempic covered

## 2022-07-31 ENCOUNTER — Encounter: Payer: Self-pay | Admitting: Family Medicine

## 2022-08-20 ENCOUNTER — Encounter (INDEPENDENT_AMBULATORY_CARE_PROVIDER_SITE_OTHER): Payer: Self-pay

## 2022-09-18 ENCOUNTER — Ambulatory Visit: Payer: BC Managed Care – PPO | Admitting: Family Medicine

## 2022-09-25 ENCOUNTER — Ambulatory Visit (INDEPENDENT_AMBULATORY_CARE_PROVIDER_SITE_OTHER): Payer: BC Managed Care – PPO | Admitting: Family Medicine

## 2022-09-25 VITALS — BP 134/84 | HR 82 | Temp 98.2°F | Wt 204.7 lb

## 2022-09-25 DIAGNOSIS — E11319 Type 2 diabetes mellitus with unspecified diabetic retinopathy without macular edema: Secondary | ICD-10-CM | POA: Diagnosis not present

## 2022-09-25 DIAGNOSIS — Z7984 Long term (current) use of oral hypoglycemic drugs: Secondary | ICD-10-CM | POA: Diagnosis not present

## 2022-09-25 DIAGNOSIS — E785 Hyperlipidemia, unspecified: Secondary | ICD-10-CM

## 2022-09-25 LAB — POCT GLYCOSYLATED HEMOGLOBIN (HGB A1C): Hemoglobin A1C: 7.2 % — AB (ref 4.0–5.6)

## 2022-09-25 NOTE — Progress Notes (Signed)
Established Patient Office Visit  Subjective   Patient ID: Todd English, male    DOB: 24-Jul-1970  Age: 52 y.o. MRN: [id number]  Chief Complaint  Patient presents with   Medical Management of Chronic Issues    HPI   Todd English is here for follow-up regarding type 2 diabetes.  Refer to last note.  He had run out of his [place] and Ozempic secondary to lack of coverage.  Remains on metformin.  Had recent A1c 9.5%.  We added glipizide 5 mg twice daily.  Tolerating well.  No side effects.  No recent hypoglycemia.  Not monitoring blood sugars regularly.  He also has hyperlipidemia treated with simvastatin 40 mg daily.  Blood pressures have been stable.  Past Medical History:  Diagnosis Date   Allergy    Depression    Patient states he does not have depression.   Diabetes mellitus    GERD (gastroesophageal reflux disease)    Patient states he does not have GERD   Hyperlipidemia    Past Surgical History:  Procedure Laterality Date   no surgical hx      reports that he quit smoking about 12 years ago. His smoking use included cigarettes. He started smoking about 27 years ago. He has a 15 pack-year smoking history. He has never used smokeless tobacco. He reports current alcohol use of about 2.0 standard drinks of alcohol per week. He reports that he does not use drugs. family history includes Diabetes Mellitus II in his maternal grandmother. No Known Allergies  Review of Systems  Constitutional:  Negative for malaise/fatigue.  Eyes:  Negative for blurred vision.  Respiratory:  Negative for shortness of breath.   Cardiovascular:  Negative for chest pain.  Genitourinary:  Negative for dysuria.  Neurological:  Negative for dizziness, weakness and headaches.      Objective:     BP 134/84 (BP Location: Left Arm, Patient Position: Sitting, Cuff Size: Large)   Pulse 82   Temp 98.2 F (36.8 C) (Oral)   Wt 204 lb 11.2 oz (92.9 kg)   SpO2 100%   BMI 27.76 kg/m  BP Readings from  Last 3 Encounters:  09/25/22 134/84  06/19/22 118/78  02/13/22 134/80   Wt Readings from Last 3 Encounters:  09/25/22 204 lb 11.2 oz (92.9 kg)  06/19/22 196 lb 6 oz (89.1 kg)  02/13/22 193 lb 14.4 oz (88 kg)      Physical Exam Vitals reviewed.  Constitutional:      Appearance: Normal appearance.  Cardiovascular:     Rate and Rhythm: Normal rate and regular rhythm.  Pulmonary:     Effort: Pulmonary effort is normal.     Breath sounds: Normal breath sounds. No wheezing or rales.  Neurological:     Mental Status: He is alert.      Results for orders placed or performed in visit on 09/25/22  POC HgB A1c  Result Value Ref Range   Hemoglobin A1C 7.2 (A) 4.0 - 5.6 %   HbA1c POC (<> result, manual entry)     HbA1c, POC (prediabetic range)     HbA1c, POC (controlled diabetic range)      Last CBC Lab Results  Component Value Date   WBC 6.7 03/03/2019   HGB 13.4 03/03/2019   HCT 41.2 03/03/2019   MCV 79.0 03/03/2019   MCH 24.6 (L) 02/23/2019   RDW 15.1 03/03/2019   PLT 523.0 (H) 03/03/2019   Last metabolic panel Lab Results  Component Value Date  GLUCOSE 127 (H) 11/12/2021   NA 139 11/12/2021   K 4.5 11/12/2021   CL 101 11/12/2021   CO2 30 11/12/2021   BUN 13 11/12/2021   CREATININE 1.13 11/12/2021   GFR 74.32 11/12/2021   CALCIUM 9.6 11/12/2021   PROT 6.7 11/12/2021   ALBUMIN 4.4 11/12/2021   BILITOT 0.5 11/12/2021   ALKPHOS 76 11/12/2021   AST 14 11/12/2021   ALT 16 11/12/2021   ANIONGAP 12 02/23/2019   Last lipids Lab Results  Component Value Date   CHOL 137 11/12/2021   HDL 61.80 11/12/2021   LDLCALC 58 11/12/2021   LDLDIRECT 82.3 02/23/2013   TRIG 83.0 11/12/2021   CHOLHDL 2 11/12/2021   Last hemoglobin A1c Lab Results  Component Value Date   HGBA1C 7.2 (A) 09/25/2022      The 10-year ASCVD risk score (Arnett DK, et al., 2019) is: 10.8%    Assessment & Plan:   Problem List Items Addressed This Visit       Unprioritized   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  Ideally, we would like to have him on SGLT2 or GLP-1 medication but cost was prohibitive with his insurance.  Continue metformin and glipizide.  Set up 54-month follow-up.  Plan to get full labs then at time of physical.  Continue low glycemic diet.  Return in about 3 months (around 12/25/2022).    Evelena Peat, MD

## 2022-09-25 NOTE — Patient Instructions (Signed)
A1C has improved to 7.2%.  Let's set up physical for some time in December.

## 2022-10-09 DIAGNOSIS — E113393 Type 2 diabetes mellitus with moderate nonproliferative diabetic retinopathy without macular edema, bilateral: Secondary | ICD-10-CM | POA: Diagnosis not present

## 2022-10-12 ENCOUNTER — Encounter: Payer: Self-pay | Admitting: Family Medicine

## 2022-11-25 ENCOUNTER — Other Ambulatory Visit: Payer: Self-pay | Admitting: Family Medicine

## 2022-12-09 ENCOUNTER — Other Ambulatory Visit: Payer: Self-pay | Admitting: Family Medicine

## 2022-12-25 ENCOUNTER — Ambulatory Visit (INDEPENDENT_AMBULATORY_CARE_PROVIDER_SITE_OTHER): Payer: BC Managed Care – PPO | Admitting: Family Medicine

## 2022-12-25 ENCOUNTER — Encounter: Payer: Self-pay | Admitting: Family Medicine

## 2022-12-25 VITALS — BP 152/90 | HR 75 | Temp 97.8°F | Wt 213.7 lb

## 2022-12-25 DIAGNOSIS — R030 Elevated blood-pressure reading, without diagnosis of hypertension: Secondary | ICD-10-CM | POA: Diagnosis not present

## 2022-12-25 DIAGNOSIS — E11319 Type 2 diabetes mellitus with unspecified diabetic retinopathy without macular edema: Secondary | ICD-10-CM | POA: Diagnosis not present

## 2022-12-25 DIAGNOSIS — E785 Hyperlipidemia, unspecified: Secondary | ICD-10-CM

## 2022-12-25 DIAGNOSIS — Z7984 Long term (current) use of oral hypoglycemic drugs: Secondary | ICD-10-CM

## 2022-12-25 LAB — COMPREHENSIVE METABOLIC PANEL
ALT: 48 U/L (ref 0–53)
AST: 34 U/L (ref 0–37)
Albumin: 4.4 g/dL (ref 3.5–5.2)
Alkaline Phosphatase: 81 U/L (ref 39–117)
BUN: 15 mg/dL (ref 6–23)
CO2: 30 meq/L (ref 19–32)
Calcium: 9.1 mg/dL (ref 8.4–10.5)
Chloride: 103 meq/L (ref 96–112)
Creatinine, Ser: 1.05 mg/dL (ref 0.40–1.50)
GFR: 80.53 mL/min (ref >60.00)
Glucose, Bld: 176 mg/dL — ABNORMAL HIGH (ref 70–99)
Potassium: 4 meq/L (ref 3.5–5.1)
Sodium: 140 meq/L (ref 135–145)
Total Bilirubin: 0.4 mg/dL (ref 0.2–1.2)
Total Protein: 7.1 g/dL (ref 6.0–8.3)

## 2022-12-25 LAB — MICROALBUMIN / CREATININE URINE RATIO
Creatinine,U: 153.4 mg/dL
Microalb Creat Ratio: 2.4 mg/g (ref 0.0–30.0)
Microalb, Ur: 3.6 mg/dL — ABNORMAL HIGH (ref 0.0–1.9)

## 2022-12-25 LAB — LIPID PANEL
Cholesterol: 154 mg/dL (ref 0–200)
HDL: 55.5 mg/dL (ref >39.00)
LDL Cholesterol: 86 mg/dL (ref 0–99)
NonHDL: 98.32
Total CHOL/HDL Ratio: 3
Triglycerides: 61 mg/dL (ref 0.0–149.0)
VLDL: 12.2 mg/dL (ref 0.0–40.0)

## 2022-12-25 LAB — POCT GLYCOSYLATED HEMOGLOBIN (HGB A1C): Hemoglobin A1C: 8.1 % — AB (ref 4.0–5.6)

## 2022-12-25 MED ORDER — ROSUVASTATIN CALCIUM 20 MG PO TABS: 20.0000 mg | ORAL_TABLET | Freq: Every day | ORAL | 3 refills | Status: DC

## 2022-12-25 NOTE — Patient Instructions (Signed)
STOP the Simvastatin and start the Rosuvastatin 20 mg once daily.   A1C (8.1) and BP both up today.  Tighten up diet and lose some weight and if not improved at follow up we really need to look at additional medication.

## 2022-12-25 NOTE — Progress Notes (Signed)
Established Patient Office Visit  Subjective   Patient ID: Todd English, male    DOB: March 13, 1970  Age: 52 y.o. MRN: [id number]  Chief Complaint  Patient presents with   Medical Management of Chronic Issues    HPI   Mattew is seen for medical follow up.  Poor compliance with diet since last visit.  Currently not exercising regularly.  Has gained 8 pounds since last visit.  Last A1c was 7.2%.  This was improving.  He remains on metformin extended release and glipizide.  Previously took [place] and responded well but had cost issues.  He then took Trulicity for a while and likewise had good clinical response with this and Ozempic but cost became prohibitive.  Not monitoring daily blood sugars.  Takes simvastatin for hyperlipidemia.  Due for follow-up labs.  No myalgias.  Has not tried other statins previously.  Last March had coronary calcium score of 0  Elevated blood pressure today without diagnosis of hypertension.  Denies any recent headaches or dizziness.  No regular alcohol use.  Has had some weight gain as above.  Today's blood pressure is atypical for him.  Past Medical History:  Diagnosis Date   Allergy    Depression    Patient states he does not have depression.   Diabetes mellitus    GERD (gastroesophageal reflux disease)    Patient states he does not have GERD   Hyperlipidemia    Past Surgical History:  Procedure Laterality Date   no surgical hx      reports that he quit smoking about 13 years ago. His smoking use included cigarettes. He started smoking about 28 years ago. He has a 15 pack-year smoking history. He has never used smokeless tobacco. He reports current alcohol use of about 2.0 standard drinks of alcohol per week. He reports that he does not use drugs. family history includes Diabetes Mellitus II in his maternal grandmother. No Known Allergies  Review of Systems  Constitutional:  Negative for chills, fever and malaise/fatigue.  Eyes:  Negative for  blurred vision.  Respiratory:  Negative for shortness of breath.   Cardiovascular:  Negative for chest pain.  Neurological:  Negative for dizziness, weakness and headaches.      Objective:     BP (!) 152/90 (BP Location: Left Arm, Patient Position: Sitting, Cuff Size: Large)   Pulse 75   Temp 97.8 F (36.6 C) (Oral)   Wt 213 lb 11.2 oz (96.9 kg)   SpO2 99%   BMI 28.98 kg/m  BP Readings from Last 3 Encounters:  12/25/22 (!) 152/90  09/25/22 134/84  06/19/22 118/78   Wt Readings from Last 3 Encounters:  12/25/22 213 lb 11.2 oz (96.9 kg)  09/25/22 204 lb 11.2 oz (92.9 kg)  06/19/22 196 lb 6 oz (89.1 kg)      Physical Exam Vitals reviewed.  Constitutional:      General: He is not in acute distress.    Appearance: He is well-developed. He is not ill-appearing.  HENT:     Right Ear: External ear normal.     Left Ear: External ear normal.  Eyes:     Pupils: Pupils are equal, round, and reactive to light.  Neck:     Thyroid: No thyromegaly.  Cardiovascular:     Rate and Rhythm: Normal rate and regular rhythm.  Pulmonary:     Effort: Pulmonary effort is normal. No respiratory distress.     Breath sounds: Normal breath sounds. No wheezing or  rales.  Musculoskeletal:     Cervical back: Neck supple.  Neurological:     Mental Status: He is alert and oriented to person, place, and time.      Results for orders placed or performed in visit on 12/25/22  POC HgB A1c  Result Value Ref Range   Hemoglobin A1C 8.1 (A) 4.0 - 5.6 %   HbA1c POC (<> result, manual entry)     HbA1c, POC (prediabetic range)     HbA1c, POC (controlled diabetic range)      Last CBC Lab Results  Component Value Date   WBC 6.7 03/03/2019   HGB 13.4 03/03/2019   HCT 41.2 03/03/2019   MCV 79.0 03/03/2019   MCH 24.6 (L) 02/23/2019   RDW 15.1 03/03/2019   PLT 523.0 (H) 03/03/2019   Last metabolic panel Lab Results  Component Value Date   GLUCOSE 127 (H) 11/12/2021   NA 139 11/12/2021   K  4.5 11/12/2021   CL 101 11/12/2021   CO2 30 11/12/2021   BUN 13 11/12/2021   CREATININE 1.13 11/12/2021   GFR 74.32 11/12/2021   CALCIUM 9.6 11/12/2021   PROT 6.7 11/12/2021   ALBUMIN 4.4 11/12/2021   BILITOT 0.5 11/12/2021   ALKPHOS 76 11/12/2021   AST 14 11/12/2021   ALT 16 11/12/2021   ANIONGAP 12 02/23/2019   Last lipids Lab Results  Component Value Date   CHOL 137 11/12/2021   HDL 61.80 11/12/2021   LDLCALC 58 11/12/2021   LDLDIRECT 82.3 02/23/2013   TRIG 83.0 11/12/2021   CHOLHDL 2 11/12/2021   Last hemoglobin A1c Lab Results  Component Value Date   HGBA1C 8.1 (A) 12/25/2022      The 10-year ASCVD risk score (Arnett DK, et al., 2019) is: 13.4%    Assessment & Plan:   #1 type 2 diabetes poorly controlled with A1c today 8.1%.  Recent poor compliance with diet and recent 8 pound weight gain.  Currently on glipizide and metformin.  He had good clinical response with [place] and Ozempic but had cost issues.  We strongly recommend he consider going back on SGLT2 or GLP-1 medication but he declines this time.  Will set up 24-month follow-up.  Try to establish more consistent exercise and reduce sugars and starches.  Will highly recommend additional medication if not improved at follow-up.  Check urine microalbumin screen.  Continue yearly diabetic eye exam  #2 hyperlipidemia.  Currently on simvastatin.  We discussed possible change to rosuvastatin 20 mg daily.  Explained rosuvastatin would have fewer drug interactions it is a more potent statin.  Check lipid and CMP today.  #3 elevated blood pressure without diagnosis of hypertension.  We discussed potential use of medication.  Would have low threshold to start ACE or ARB with his diabetes.  He declines at this time.  Recommend close home monitoring and follow-up at follow-up visit start medication.  Handout on DASH diet given.  Watch sodium intake closely.  Try to lose some weight.  Suspect his recent weight gain is  contributing   Return in about 3 months (around 03/25/2023).    Evelena Peat, MD

## 2023-02-01 ENCOUNTER — Other Ambulatory Visit (HOSPITAL_COMMUNITY): Payer: Self-pay

## 2023-02-19 ENCOUNTER — Other Ambulatory Visit: Payer: Self-pay | Admitting: Family Medicine

## 2023-03-25 ENCOUNTER — Telehealth: Payer: Self-pay

## 2023-03-25 NOTE — Telephone Encounter (Signed)
-----   Message from Evelena Peat sent at 03/25/2023 11:35 AM EDT ----- Danira Nylander,  Will you see if someone can block my 7:45 and 8:45 AM appts tomorrow?   I think some of the early visits are going to need more time.  Thanks!  Smitty Cords

## 2023-03-25 NOTE — Telephone Encounter (Signed)
 Done

## 2023-03-26 ENCOUNTER — Encounter: Payer: Self-pay | Admitting: Family Medicine

## 2023-03-26 ENCOUNTER — Ambulatory Visit: Payer: BC Managed Care – PPO | Admitting: Family Medicine

## 2023-03-26 VITALS — BP 140/84 | HR 77 | Temp 97.8°F | Wt 208.2 lb

## 2023-03-26 DIAGNOSIS — E785 Hyperlipidemia, unspecified: Secondary | ICD-10-CM

## 2023-03-26 DIAGNOSIS — E11319 Type 2 diabetes mellitus with unspecified diabetic retinopathy without macular edema: Secondary | ICD-10-CM

## 2023-03-26 DIAGNOSIS — R030 Elevated blood-pressure reading, without diagnosis of hypertension: Secondary | ICD-10-CM

## 2023-03-26 DIAGNOSIS — Z7984 Long term (current) use of oral hypoglycemic drugs: Secondary | ICD-10-CM

## 2023-03-26 LAB — POCT GLYCOSYLATED HEMOGLOBIN (HGB A1C): Hemoglobin A1C: 9 % — AB (ref 4.0–5.6)

## 2023-03-26 MED ORDER — METFORMIN HCL ER 750 MG PO TB24: ORAL_TABLET | ORAL | 3 refills | Status: AC

## 2023-03-26 NOTE — Progress Notes (Signed)
 Established Patient Office Visit  Subjective   Patient ID: Todd English, male    DOB: 09-Apr-1970  Age: 53 y.o. MRN: [id number]  Chief Complaint  Patient presents with   Medical Management of Chronic Issues    HPI   Todd English is seen for medical follow-up.  History of recent poorly controlled diabetes.  He was reluctant to add more medication because of cost issues previously.  Currently on glipizide 5 mg twice daily and metformin extended release 750 mg once daily.  Not monitoring blood sugars regularly.  He states he has been working out 70 hours/week.  Not exercising much.  Weight relatively stable.  Recent elevated LDL cholesterol on simvastatin and we changed to rosuvastatin 20 mg daily.  Tolerating well.  Blood pressure is up slightly today.  Usually blood pressures been very well-controlled.  Past Medical History:  Diagnosis Date   Allergy    Depression    Patient states he does not have depression.   Diabetes mellitus    GERD (gastroesophageal reflux disease)    Patient states he does not have GERD   Hyperlipidemia    Past Surgical History:  Procedure Laterality Date   no surgical hx      reports that he quit smoking about 13 years ago. His smoking use included cigarettes. He started smoking about 28 years ago. He has a 15 pack-year smoking history. He has never used smokeless tobacco. He reports current alcohol use of about 2.0 standard drinks of alcohol per week. He reports that he does not use drugs. family history includes Diabetes Mellitus II in his maternal grandmother. No Known Allergies  Review of Systems  Constitutional:  Negative for malaise/fatigue.  Eyes:  Negative for blurred vision.  Respiratory:  Negative for shortness of breath.   Cardiovascular:  Negative for chest pain.  Neurological:  Negative for dizziness, weakness and headaches.      Objective:     BP (!) 140/84 (BP Location: Left Arm, Patient Position: Sitting, Cuff Size: Large)    Pulse 77   Temp 97.8 F (36.6 C) (Oral)   Wt 208 lb 3.2 oz (94.4 kg)   SpO2 96%   BMI 28.24 kg/m  BP Readings from Last 3 Encounters:  03/26/23 (!) 140/84  12/25/22 (!) 152/90  09/25/22 134/84   Wt Readings from Last 3 Encounters:  03/26/23 208 lb 3.2 oz (94.4 kg)  12/25/22 213 lb 11.2 oz (96.9 kg)  09/25/22 204 lb 11.2 oz (92.9 kg)      Physical Exam Vitals reviewed.  Constitutional:      General: He is not in acute distress.    Appearance: He is well-developed. He is not ill-appearing.  HENT:     Right Ear: External ear normal.     Left Ear: External ear normal.  Eyes:     Pupils: Pupils are equal, round, and reactive to light.  Neck:     Thyroid: No thyromegaly.  Cardiovascular:     Rate and Rhythm: Normal rate and regular rhythm.  Pulmonary:     Effort: Pulmonary effort is normal. No respiratory distress.     Breath sounds: Normal breath sounds. No wheezing or rales.  Musculoskeletal:     Cervical back: Neck supple.     Right lower leg: No edema.     Left lower leg: No edema.  Neurological:     Mental Status: He is alert and oriented to person, place, and time.      Results for orders  placed or performed in visit on 03/26/23  POC HgB A1c  Result Value Ref Range   Hemoglobin A1C 9.0 (A) 4.0 - 5.6 %   HbA1c POC (<> result, manual entry)     HbA1c, POC (prediabetic range)     HbA1c, POC (controlled diabetic range)      Last CBC Lab Results  Component Value Date   WBC 6.7 03/03/2019   HGB 13.4 03/03/2019   HCT 41.2 03/03/2019   MCV 79.0 03/03/2019   MCH 24.6 (L) 02/23/2019   RDW 15.1 03/03/2019   PLT 523.0 (H) 03/03/2019   Last metabolic panel Lab Results  Component Value Date   GLUCOSE 176 (H) 12/25/2022   NA 140 12/25/2022   K 4.0 12/25/2022   CL 103 12/25/2022   CO2 30 12/25/2022   BUN 15 12/25/2022   CREATININE 1.05 12/25/2022   GFR 80.53 12/25/2022   CALCIUM 9.1 12/25/2022   PROT 7.1 12/25/2022   ALBUMIN 4.4 12/25/2022   BILITOT 0.4  12/25/2022   ALKPHOS 81 12/25/2022   AST 34 12/25/2022   ALT 48 12/25/2022   ANIONGAP 12 02/23/2019   Last lipids Lab Results  Component Value Date   CHOL 154 12/25/2022   HDL 55.50 12/25/2022   LDLCALC 86 12/25/2022   LDLDIRECT 82.3 02/23/2013   TRIG 61.0 12/25/2022   CHOLHDL 3 12/25/2022   Last hemoglobin A1c Lab Results  Component Value Date   HGBA1C 9.0 (A) 03/26/2023      The 10-year ASCVD risk score (Arnett DK, et al., 2019) is: 12.4%    Assessment & Plan:   #1 elevated blood pressure reading.  Generally well-controlled.  Recommend close home monitoring.  Be in touch if consistently greater than 140/90.  Work on some weight loss.  Reassess in 3 months.  If still up at that point consider initiating ACE or ARB  #2 type 2 diabetes poorly controlled A1c today 9.0%.  We have previously strongly recommended SGLT2 or GLP-1 medication but he is very reluctant because of cost.  He does agree to increasing metformin extended release 750 mg to twice daily.  Continue glipizide 5 mg twice daily.  Try to step of exercise and tighten up diet.  We suggested carbohydrate tracking app and try to keep daily carbs 150 g or less.  Set up 41-month follow-up  #3 dyslipidemia.  Patient recently switched to rosuvastatin and tolerating well.  Needs follow-up lipids.  He is not fasting today.  Will plan fasting lipid panel 20-month follow-up   Return in about 3 months (around 06/26/2023).    Evelena Peat, MD

## 2023-03-26 NOTE — Patient Instructions (Signed)
 Increase the Metformin to TWICE DAILY    Consider nutrient tracker such as Myfitness pal and try to keep daily carbs < 150 grams.

## 2023-03-29 ENCOUNTER — Telehealth: Payer: Self-pay

## 2023-03-29 NOTE — Progress Notes (Signed)
   03/29/2023  Patient ID: Shela Commons, male   DOB: [birth year], 53 y.o.   MRN: [id number]  Attempted to contact patient to schedule appt for [facility] DM. Left HIPAA compliant message for patient to return my call at their convenience.   Sherrill Raring, PharmD Clinical Pharmacist [phone]

## 2023-03-30 ENCOUNTER — Telehealth: Payer: Self-pay | Admitting: *Deleted

## 2023-03-30 NOTE — Telephone Encounter (Signed)
 Patient was identified as falling into the [place] Measure - Diabetes.   Patient was: Left voicemail to schedule with primary care provider.

## 2023-04-02 ENCOUNTER — Telehealth: Payer: Self-pay

## 2023-04-02 NOTE — Progress Notes (Signed)
 04/02/2023 Name: Todd English MRN: [id number] DOB: [birth year]  Chief Complaint  Patient presents with   Diabetes   Medication Management    Todd English is a 53 y.o. year old male who presented for a telephone visit.   They were referred to the pharmacist for assistance in managing diabetes.    Subjective:  Care Team: Primary Care Provider: Kristian Covey, MD   Medication Access/Adherence  Current Pharmacy:  [facility] [id number] [place], [place] - [address] [place] [address] Phone: [phone] Fax: [phone]  [place] [facility] [address] [place] [address] Phone: [phone] Fax: [phone]  [facility] [address] [place] [address] Phone: [phone] Fax: [phone]   Patient reports affordability concerns with their medications: No  Patient reports access/transportation concerns to their pharmacy: No  Patient reports adherence concerns with their medications:  No     Diabetes:  Current medications: Glipizide 5mg  BID, Metformin XR 750mg  BID Medications tried in the past: Trulicity (cost), [place] (cost)  Current glucose readings: Not checking, does have supplies to check at home though  Observed patterns:  Patient denies hypoglycemic s/sx including dizziness, shakiness, sweating. Patient denies hyperglycemic symptoms including polyuria, polydipsia, polyphagia, nocturia, neuropathy, blurred vision.  Current meal patterns:  -Works 12 hour shifts right now, working on tightening up diet   Objective:  Lab Results  Component Value Date   HGBA1C 9.0 (A) 03/26/2023    Lab Results  Component Value Date   CREATININE 1.05 12/25/2022   BUN 15 12/25/2022   NA 140 12/25/2022   K 4.0 12/25/2022   CL 103 12/25/2022   CO2 30 12/25/2022    Lab Results  Component Value Date   CHOL 154 12/25/2022   HDL 55.50  12/25/2022   LDLCALC 86 12/25/2022   LDLDIRECT 82.3 02/23/2013   TRIG 61.0 12/25/2022   CHOLHDL 3 12/25/2022    Medications Reviewed Today     Reviewed by Sherrill Raring, RPH (Pharmacist) on 04/02/23 at 1556  Med List Status: <None>   Medication Order Taking? Sig Documenting Provider Last Dose Status Informant  aspirin EC 81 MG tablet [id number] No Take 81 mg by mouth daily. [provider] Taking Active Self  Blood Glucose Monitoring Suppl (ONETOUCH VERIO FLEX SYSTEM) w/Device KIT [id number] No Use to check blood glucose two times daily. Dx Code E11.65 Kristian Covey, MD Taking Active   glipiZIDE (GLUCOTROL) 5 MG tablet [id number] No TAKE 1 TABLET (5 MG TOTAL) BY MOUTH TWICE A DAY BEFORE MEALS Burchette, Elberta Fortis, MD Taking Active   metFORMIN (GLUCOPHAGE-XR) 750 MG 24 hr tablet [id number]  Take one tablet  by mouth twice daily Kristian Covey, MD  Active   OneTouch Delica Lancets 33G MISC [id number] No Use to check blood glucose two times daily. Dx Code T06.26 Kristian Covey, MD Taking Active   [facility] VERIO test strip [id number] No USE AS INSTRUCTED TO CHECK BLOOD GLUCOSE TWO TIMES DAILY. DX CODE E11.65 Kristian Covey, MD Taking Active   rosuvastatin (CRESTOR) 20 MG tablet [id number]  Take 1 tablet (20 mg total) by mouth daily. Kristian Covey, MD  Active   VITAMIN D PO [id number] No Take 1 capsule by mouth daily. [provider] Taking Active Self              Assessment/Plan:   Diabetes: - Currently uncontrolled - Reviewed long term cardiovascular and renal  outcomes of uncontrolled blood sugar - Reviewed goal A1c, goal fasting, and goal 2 hour post prandial glucose - Reviewed dietary modifications including low carb diet - Recommend to continue current medication therapy  - Recommend to check glucose daily - Preferred medications such as GLP-1 and SGLT2 are cost prohibitive for patient, even with copay cards at this time    Follow Up Plan:  1 month  Sherrill Raring, PharmD Clinical Pharmacist [phone]

## 2023-04-05 MED ORDER — ONETOUCH VERIO VI STRP: ORAL_STRIP | 3 refills | Status: AC

## 2023-04-05 NOTE — Addendum Note (Signed)
 Addended by: Christy Sartorius on: 04/05/2023 08:10 AM   Modules accepted: Orders

## 2023-04-09 ENCOUNTER — Other Ambulatory Visit: Payer: Self-pay | Admitting: Family Medicine

## 2023-05-03 ENCOUNTER — Other Ambulatory Visit (INDEPENDENT_AMBULATORY_CARE_PROVIDER_SITE_OTHER)

## 2023-05-03 VITALS — BP 123/70

## 2023-05-03 DIAGNOSIS — E11319 Type 2 diabetes mellitus with unspecified diabetic retinopathy without macular edema: Secondary | ICD-10-CM

## 2023-05-03 DIAGNOSIS — R030 Elevated blood-pressure reading, without diagnosis of hypertension: Secondary | ICD-10-CM

## 2023-05-03 NOTE — Progress Notes (Signed)
 05/03/2023 Name: Todd English MRN: [id number] DOB: 07-Mar-1970  No chief complaint on file.   Todd English is a 53 y.o. year old male who presented for a telephone visit.   They were referred to the pharmacist for assistance in managing diabetes.    Subjective:  Care Team: Primary Care Provider: Marquetta Sit, MD   Medication Access/Adherence  Current Pharmacy:  [facility] [phone] - [place], [place] - [address] [place] [address] Phone: [phone] Fax: [phone]  [place] [facility] [address], [address] [place] [address] Phone: [phone] Fax: [phone]  [facility] [address] [place] [address] Phone: [phone] Fax: [phone]   Patient reports affordability concerns with their medications: No  Patient reports access/transportation concerns to their pharmacy: No  Patient reports adherence concerns with their medications:  No     Diabetes:  Current medications: Glipizide  5mg  BID, Metformin  XR 750mg  BID Medications tried in the past: Trulicity  (cost), Farxiga  (cost)  Current glucose readings:  Checking twice daily Fasting 116 yesterday, 105 post-prandial Denies any fasting readings above 130 or seeing any sugars above 150 No readings below 70 to report  Observed patterns:  Patient denies hypoglycemic s/sx including dizziness, shakiness, sweating. Patient denies hyperglycemic symptoms including polyuria, polydipsia, polyphagia, nocturia, neuropathy, blurred vision.  Current meal patterns:  -Works 12 hour shifts right now, working on tightening up diet  Elevated BP at last OV:  Current medications: None Medications previously tried: None  Patient has a validated, automated, upper arm home BP cuff Current blood pressure readings readings: 120s/70s, most recent 123/70, checking every other day at minimum  Patient  denies hypotensive s/sx including dizziness, lightheadedness.  Patient denies hypertensive symptoms including headache, chest pain, shortness of breath     Objective:  Lab Results  Component Value Date   HGBA1C 9.0 (A) 03/26/2023    Lab Results  Component Value Date   CREATININE 1.05 12/25/2022   BUN 15 12/25/2022   NA 140 12/25/2022   K 4.0 12/25/2022   CL 103 12/25/2022   CO2 30 12/25/2022    Lab Results  Component Value Date   CHOL 154 12/25/2022   HDL 55.50 12/25/2022   LDLCALC 86 12/25/2022   LDLDIRECT 82.3 02/23/2013   TRIG 61.0 12/25/2022   CHOLHDL 3 12/25/2022    Medications Reviewed Today     Reviewed by Carnell Christian, RPH (Pharmacist) on 05/03/23 at [id number]  Med List Status: <None>   Medication Order Taking? Sig Documenting Provider Last Dose Status Informant  aspirin  EC 81 MG tablet [id number] Yes Take 81 mg by mouth daily. [provider] Taking Active Self  Blood Glucose Monitoring Suppl (ONETOUCH VERIO FLEX SYSTEM) w/Device KIT [id number]  Use to check blood glucose two times daily. Dx Code E11.65 Marquetta Sit, MD  Active   glipiZIDE  (GLUCOTROL ) 5 MG tablet [id number] Yes TAKE 1 TABLET (5 MG TOTAL) BY MOUTH TWICE A DAY BEFORE MEALS Burchette, Marijean Shouts, MD Taking Active   glucose blood (ONETOUCH VERIO) test strip [id number]  Use as instructed to check blood glucose two times daily. Dx Code E11.65 Marquetta Sit, MD  Active   metFORMIN  (GLUCOPHAGE -XR) 750 MG 24 hr tablet [id number] Yes Take one tablet  by mouth twice daily Marquetta Sit, MD Taking Active   OneTouch Delica Lancets 33G MISC [id number]  Use to check blood glucose two times daily. Dx Code E11.65 Marquetta Sit,  MD  Active   rosuvastatin  (CRESTOR ) 20 MG tablet [id number] Yes Take 1 tablet (20 mg total) by mouth daily. Marquetta Sit, MD Taking Active   VITAMIN D  PO [id number] Yes Take 1 capsule by mouth daily. [provider] Taking Active Self               Assessment/Plan:   Diabetes: - Currently uncontrolled - Reviewed long term cardiovascular and renal outcomes of uncontrolled blood sugar - Reviewed goal A1c, goal fasting, and goal 2 hour post prandial glucose - Reviewed dietary modifications including low carb diet - Recommend to continue current medication therapy  - Recommend to check glucose daily - Preferred medications such as GLP-1 and SGLT2 are cost prohibitive for patient, even with copay cards at this time  Elevated BP: - Currently controlled per home readings - Reviewed long term cardiovascular and renal outcomes of uncontrolled blood pressure - Reviewed appropriate blood pressure monitoring technique and reviewed goal blood pressure. Recommended to check home blood pressure and heart rate at least once weekly - Recommend to consider addition of ACEI/ARB in future if BP becomes uncontrolled         Follow Up Plan: 1 month  Carnell Christian, PharmD Clinical Pharmacist [phone]

## 2023-05-18 ENCOUNTER — Telehealth: Payer: Self-pay | Admitting: *Deleted

## 2023-05-18 NOTE — Telephone Encounter (Signed)
 Patient was identified as falling into the [place] Measure - Diabetes.   Patient was: Left voicemail to schedule with primary care provider.

## 2023-06-02 ENCOUNTER — Other Ambulatory Visit

## 2023-06-08 ENCOUNTER — Other Ambulatory Visit: Payer: Self-pay | Admitting: Family Medicine

## 2023-06-21 ENCOUNTER — Telehealth: Payer: Self-pay

## 2023-06-21 NOTE — Progress Notes (Signed)
   06/21/2023  Patient ID: Todd English, male   DOB: [birth year], 53 y.o.   MRN: [id number]  Attempted to contact patient to schedule appt for TNM DM. Left HIPAA compliant message for patient to return my call at their convenience.   Carnell Christian, PharmD Clinical Pharmacist [phone]

## 2023-07-06 ENCOUNTER — Telehealth: Payer: Self-pay

## 2023-07-06 NOTE — Progress Notes (Signed)
   07/06/2023  Patient ID: Todd English, male   DOB: [birth year], 53 y.o.   MRN: [id number]  Attempted to contact patient to schedule appt for TNM DM. Left HIPAA compliant message for patient to return my call at their convenience.   Jon VEAR Lindau, PharmD Clinical Pharmacist [phone]

## 2023-08-10 ENCOUNTER — Telehealth: Payer: Self-pay | Admitting: Pharmacy Technician

## 2023-08-10 NOTE — Progress Notes (Signed)
   08/10/2023  Patient ID: Todd English, male   DOB: [birth year], 53 y.o.   MRN: [id number]  Patient engaged with clinical pharmacist for management of diabetes on 05/03/2023. Outreach by [employer] technician was requested.   Outreached patient to discuss diabetes medication management. Left voicemail for patient to return my call at their convenience.    Margurette Brener, CPhT [facility] Office: [phone] Email: [email or web address]

## 2023-08-12 ENCOUNTER — Telehealth: Payer: Self-pay | Admitting: Pharmacy Technician

## 2023-08-12 NOTE — Progress Notes (Signed)
   08/12/2023  Patient ID: Todd English, male   DOB: May 18, 1970, 53 y.o.   MRN: [id number]  Patient engaged with clinical pharmacist for management of diabetes on 05/03/2023. 2nd outreach by [facility] was requested.   Outreached patient to discuss diabetes medication management. Left voicemail for patient to return my call at their convenience.   Dorise Gangi, CPhT [facility] Office: [phone] Email: [email or web address]

## 2023-08-13 ENCOUNTER — Telehealth: Payer: Self-pay | Admitting: Pharmacy Technician

## 2023-08-13 NOTE — Progress Notes (Signed)
   08/13/2023  Patient ID: Todd English, male   DOB: May 09, 1970, 53 y.o.   MRN: [id number]  Patient engaged with clinical pharmacist for management of diabetes on 05/03/2023. Outreach by [employer] technician was requested.   Outreached patient to discuss diabetes medication management. Patient returned the call from yesterday and left a voicemail. I attempted to call patient this morning. Unfortunately he did not answer. Left voicemail for patient to return my call at their convenience.   Nikola Marone, CPhT [facility] Office: [phone] Email: [name]

## 2023-08-16 ENCOUNTER — Telehealth: Payer: Self-pay | Admitting: Pharmacy Technician

## 2023-08-16 NOTE — Progress Notes (Signed)
   08/16/2023  Patient ID: Todd English, male   DOB: [birth year], 53 y.o.   MRN: [id number]  Patient engaged with clinical pharmacist for management of diabetes on 05/03/2023. Outreach by [employer] technician was requested.   Outreached patient to discuss diabetes medication management. Left voicemail for patient to return my call at their convenience. Mychart message sent to patient today.  Ebonique Hallstrom, CPhT [facility] Office: [phone] Email: [email or web address]

## 2023-08-18 ENCOUNTER — Telehealth: Payer: Self-pay

## 2023-08-18 ENCOUNTER — Telehealth: Payer: Self-pay | Admitting: Pharmacy Technician

## 2023-08-18 LAB — OPHTHALMOLOGY REPORT-SCANNED

## 2023-08-18 NOTE — Progress Notes (Addendum)
   08/18/2023  Patient ID: Todd English, male   DOB: Jul 17, 1970, 53 y.o.   MRN: [id number]  Patient engaged with clinical pharmacist for management of diabetes on 05/03/2023. Patient left voicemail in return to the voicemail I left for him.  Outreached patient to discuss diabetes medication management. Left voicemail for patient to return my call at their convenience. Informed patient in voicemail  of my working hours and informed patient could reply to mychart that was sent to him on 08/16/23 if his schedule does not allow him to  return the call during my working hours.  ADDENDUM 08/18/2023 Patient returned the call.   Patient is appearing on a report for [employer] [place] [facility] Diabetes and last engaged with the clinical pharmacist to discuss diabetes on 05/03/2023. Contacted patient today to discuss diabetes management and completed medication review.   Diabetes Plan from last clinical pharmacist appointment:  Diabetes: - Currently uncontrolled - Reviewed long term cardiovascular and renal outcomes of uncontrolled blood sugar - Reviewed goal A1c, goal fasting, and goal 2 hour post prandial glucose - Reviewed dietary modifications including low carb diet - Recommend to continue current medication therapy  - Recommend to check glucose daily - Preferred medications such as GLP-1 and SGLT2 are cost prohibitive for patient, even with copay cards at this time   Elevated BP: - Currently controlled per home readings - Reviewed long term cardiovascular and renal outcomes of uncontrolled blood pressure - Reviewed appropriate blood pressure monitoring technique and reviewed goal blood pressure. Recommended to check home blood pressure and heart rate at least once weekly - Recommend to consider addition of ACEI/ARB in future if BP becomes uncontrolled (copy/paste from last note)   Medication Adherence Barriers Identified:  Patient made recommended medication changes per plan: Yes Patient informs he  is taking  Glipizide  5mg  twice a day and Metformin  XR 750mg  twice a day. He reports adherence to these medications and he reports having them onhand. Access issues with any new medication or testing device: No Per Dr Annemarie, Metformin  last filled on 08/06/23 for 90 days supply and Glipizide  on 06/10/23 for 90 day supply.  Patient is checking blood sugars as prescribed: Yes He informs he does fingerstick blood sugar checks. He reports readings of 119, 138, 97 and 125. He ifnorms he has been working a lot of hours and different shifts. He reports he only eats 2 meals in a 16 hour period due to being so busy with work and personal matters. He informs his blood pressure has been elevated. He reports 2 readings of 142/80 and 138/84. He reports he usually checks it as soon as he gets home from work.   Medication Adherence Barriers Addressed/Actions Taken:  Reviewed medication changes per plan from last clinical pharmacist note Educated patient to contact pharmacy regarding new prescriptions/refills Reminded patient to schedule a follow up with Dr. Wolm Scarlet. He informs he will try and get his scheduled for next month. Reviewed instructions for monitoring blood sugars at home and reminded patient to keep a written log to review with pharmacist Reminded patient of date/time of upcoming clinical pharmacist follow up and any upcoming PCP/specialists visits. Patient denies transportation barriers to the appointment. Yes  Next clinical pharmacist appointment is scheduled for: TBD  Kate Caddy, CPhT [facility] Office: [phone] Email: [email or web address]   Kate Caddy, CPhT [facility] Office: [phone] Email: [email or web address]

## 2023-08-18 NOTE — Progress Notes (Signed)
   08/18/2023  Patient ID: Adrianne LITTIE Ober, male   DOB: [birth year], 53 y.o.   MRN: [id number]  Attempted to contact patient to schedule appt for TNM DM. Left HIPAA compliant message for patient to return my call at their convenience.   Jon VEAR Lindau, PharmD Clinical Pharmacist [phone]

## 2023-09-17 ENCOUNTER — Other Ambulatory Visit: Payer: Self-pay | Admitting: Family Medicine

## 2023-09-22 ENCOUNTER — Encounter: Payer: Self-pay | Admitting: Family Medicine

## 2023-09-22 ENCOUNTER — Ambulatory Visit: Admitting: Family Medicine

## 2023-09-22 VITALS — BP 138/78 | HR 77 | Temp 97.8°F | Wt 205.5 lb

## 2023-09-22 DIAGNOSIS — E785 Hyperlipidemia, unspecified: Secondary | ICD-10-CM | POA: Diagnosis not present

## 2023-09-22 DIAGNOSIS — R030 Elevated blood-pressure reading, without diagnosis of hypertension: Secondary | ICD-10-CM | POA: Diagnosis not present

## 2023-09-22 DIAGNOSIS — E11319 Type 2 diabetes mellitus with unspecified diabetic retinopathy without macular edema: Secondary | ICD-10-CM

## 2023-09-22 LAB — POCT GLYCOSYLATED HEMOGLOBIN (HGB A1C): Hemoglobin A1C: 7.3 % — AB (ref 4.0–5.6)

## 2023-09-22 MED ORDER — EMPAGLIFLOZIN 10 MG PO TABS: 10.0000 mg | ORAL_TABLET | Freq: Every day | ORAL | 3 refills | Status: AC

## 2023-09-22 NOTE — Progress Notes (Signed)
 Established Patient Office Visit  Subjective   Patient ID: Todd English, male    DOB: [birth year]  Age: 53 y.o. MRN: [id number]  Chief Complaint  Patient presents with   Medical Management of Chronic Issues    HPI   Todd English is seen for medical follow-up.  He has history of type 2 diabetes with history of retinopathy.  Does need follow-up diabetic eye exam.  Denies any recent visual changes.  Currently on glipizide  and metformin .  Previously took Trulicity  and Ozempic  but had cost issues and had to quit.  Not monitoring blood sugars regularly.  Weight has been stable.  He feels like he is made some recent positive dietary changes.  We had to add back Glucotrol  because of lack of coverage of GLP-1 previously.  He has hyperlipidemia treated with rosuvastatin  20 mg daily.  No myalgias.  Due for follow-up lipids in December.  Elevated blood pressure without diagnosis of hypertension.  He states he rushed here from work and thinks his initial blood pressure was up because of that.  Did come down substantially after rest.  No recent consistent headaches.  Tries to watch sodium intake closely.  No regular alcohol.  Past Medical History:  Diagnosis Date   Allergy    Depression    Patient states he does not have depression.   Diabetes mellitus    GERD (gastroesophageal reflux disease)    Patient states he does not have GERD   Hyperlipidemia    Past Surgical History:  Procedure Laterality Date   no surgical hx      reports that he quit smoking about 13 years ago. His smoking use included cigarettes. He started smoking about 28 years ago. He has a 15 pack-year smoking history. He has never used smokeless tobacco. He reports current alcohol use of about 2.0 standard drinks of alcohol per week. He reports that he does not use drugs. family history includes Diabetes Mellitus II in his maternal grandmother. No Known Allergies  Review of Systems  Constitutional:  Negative for  malaise/fatigue.  Eyes:  Negative for blurred vision.  Respiratory:  Negative for shortness of breath.   Cardiovascular:  Negative for chest pain.  Neurological:  Negative for dizziness, weakness and headaches.      Objective:     BP 138/78 (BP Location: Left Arm, Cuff Size: Large)   Pulse 77   Temp 97.8 F (36.6 C) (Oral)   Wt 205 lb 8 oz (93.2 kg)   SpO2 99%   BMI 27.87 kg/m  BP Readings from Last 3 Encounters:  09/22/23 138/78  05/03/23 123/70  03/26/23 (!) 140/84   Wt Readings from Last 3 Encounters:  09/22/23 205 lb 8 oz (93.2 kg)  03/26/23 208 lb 3.2 oz (94.4 kg)  12/25/22 213 lb 11.2 oz (96.9 kg)      Physical Exam Vitals reviewed.  Constitutional:      General: He is not in acute distress.    Appearance: He is well-developed.  HENT:     Right Ear: External ear normal.     Left Ear: External ear normal.  Eyes:     Pupils: Pupils are equal, round, and reactive to light.  Neck:     Thyroid : No thyromegaly.  Cardiovascular:     Rate and Rhythm: Normal rate and regular rhythm.  Pulmonary:     Effort: Pulmonary effort is normal. No respiratory distress.     Breath sounds: Normal breath sounds. No wheezing or rales.  Musculoskeletal:  Cervical back: Neck supple.  Neurological:     Mental Status: He is alert and oriented to person, place, and time.      Results for orders placed or performed in visit on 09/22/23  POC HgB A1c  Result Value Ref Range   Hemoglobin A1C 7.3 (A) 4.0 - 5.6 %   HbA1c POC (<> result, manual entry)     HbA1c, POC (prediabetic range)     HbA1c, POC (controlled diabetic range)        The 10-year ASCVD risk score (Arnett DK, et al., 2019) is: 12.6%    Assessment & Plan:   #1 type 2 diabetes improved with A1c today 7.3% down from 9.0 last visit.  We reviewed goal of less than 7.  Continue metformin  and glipizide  and see if we can get coverage for starting Jardiance  10 mg once daily.  Set up 52-month follow-up.  Check urine  microalbumin screen at follow-up.  Needs to set up diabetic eye exam as well  #2 elevated blood pressure.  Did come down some after rest but still not optimal.  Discussed importance of low-sodium diet and reviewed dietary factors.  Monitor for now and be in touch if consistently greater than 140 systolic or 90 diastolic  #3 hyperlipidemia.  Patient on rosuvastatin  and tolerating well.  Recheck fasting lipids at follow-up in 3 months.  Continue low saturated fat diet   Return in about 3 months (around 12/22/2023).    Wolm Scarlet, MD

## 2023-09-22 NOTE — Patient Instructions (Signed)
 A1C today was 7.3%- which is improved from 9 last visit  See if we can get the Jardiance  covered and start one daily  Set up 3 month follow up.

## 2023-12-08 ENCOUNTER — Other Ambulatory Visit: Payer: Self-pay | Admitting: Family Medicine

## 2023-12-22 ENCOUNTER — Ambulatory Visit: Admitting: Family Medicine

## 2023-12-22 VITALS — BP 158/80 | HR 78 | Temp 98.2°F | Wt 205.4 lb

## 2023-12-22 DIAGNOSIS — E785 Hyperlipidemia, unspecified: Secondary | ICD-10-CM

## 2023-12-22 DIAGNOSIS — Z7984 Long term (current) use of oral hypoglycemic drugs: Secondary | ICD-10-CM | POA: Diagnosis not present

## 2023-12-22 DIAGNOSIS — E11319 Type 2 diabetes mellitus with unspecified diabetic retinopathy without macular edema: Secondary | ICD-10-CM

## 2023-12-22 DIAGNOSIS — R030 Elevated blood-pressure reading, without diagnosis of hypertension: Secondary | ICD-10-CM

## 2023-12-22 LAB — POCT GLYCOSYLATED HEMOGLOBIN (HGB A1C): Hemoglobin A1C: 7.2 % — AB (ref 4.0–5.6)

## 2023-12-22 NOTE — Patient Instructions (Signed)
 Set up labs for this Friday  Monitor blood pressure and bring back some readings for review at follow up.

## 2023-12-22 NOTE — Progress Notes (Signed)
 Established Patient Office Visit  Subjective   Patient ID: Todd English, male    DOB: 14-Feb-1970  Age: 53 y.o. MRN: [id number]  Chief Complaint  Patient presents with   Medical Management of Chronic Issues    HPI   Todd English seen for chronic medical management.  Has history of GERD, type 2 diabetes, hyperlipidemia.  He is due for follow-up labs but unfortunately lab was closed at the time he got here today.  He is currently on glipizide  and metformin  for diabetes.  We have tried several times to get him on Jardiance  but to no avail because of insurance lack of coverage.  He has tried to tighten up diet somewhat.  Last A1c 7.3%.  Needs urine microalbumin screen.  He states he had diabetic eye exam last summer with no retinopathy.  He has hyperlipidemia treated with rosuvastatin .  No myalgias.  Needs follow-up labs.  No recent chest pains.  Blood pressure up today.  He thinks some of this is stress related with just getting off work.  He states home blood pressures have generally been much better controlled  Past Medical History:  Diagnosis Date   Allergy    Depression    Patient states he does not have depression.   Diabetes mellitus    GERD (gastroesophageal reflux disease)    Patient states he does not have GERD   Hyperlipidemia    Past Surgical History:  Procedure Laterality Date   no surgical hx      reports that he quit smoking about 13 years ago. His smoking use included cigarettes. He started smoking about 29 years ago. He has a 15 pack-year smoking history. He has never used smokeless tobacco. He reports current alcohol use of about 2.0 standard drinks of alcohol per week. He reports that he does not use drugs. family history includes Diabetes Mellitus II in his maternal grandmother. Allergies[1]  Review of Systems  Constitutional:  Negative for malaise/fatigue.  Eyes:  Negative for blurred vision.  Respiratory:  Negative for shortness of breath.    Cardiovascular:  Negative for chest pain.  Neurological:  Negative for dizziness, weakness and headaches.      Objective:     BP (!) 158/80 (BP Location: Left Arm, Cuff Size: Normal)   Pulse 78   Temp 98.2 F (36.8 C) (Oral)   Wt 205 lb 6.4 oz (93.2 kg)   SpO2 98%   BMI 27.86 kg/m  BP Readings from Last 3 Encounters:  12/22/23 (!) 158/80  09/22/23 138/78  05/03/23 123/70   Wt Readings from Last 3 Encounters:  12/22/23 205 lb 6.4 oz (93.2 kg)  09/22/23 205 lb 8 oz (93.2 kg)  03/26/23 208 lb 3.2 oz (94.4 kg)      Physical Exam Vitals reviewed.  Constitutional:      Appearance: He is well-developed.  HENT:     Right Ear: External ear normal.     Left Ear: External ear normal.  Eyes:     Pupils: Pupils are equal, round, and reactive to light.  Neck:     Thyroid : No thyromegaly.  Cardiovascular:     Rate and Rhythm: Normal rate and regular rhythm.  Pulmonary:     Effort: Pulmonary effort is normal. No respiratory distress.     Breath sounds: Normal breath sounds. No wheezing or rales.  Musculoskeletal:     Cervical back: Neck supple.  Skin:    Comments: Feet reveal no lesions.  Normal sensory function with monofilament  testing  Neurological:     Mental Status: He is alert and oriented to person, place, and time.      Results for orders placed or performed in visit on 12/22/23  POC HgB A1c  Result Value Ref Range   Hemoglobin A1C 7.2 (A) 4.0 - 5.6 %   HbA1c POC (<> result, manual entry)     HbA1c, POC (prediabetic range)     HbA1c, POC (controlled diabetic range)        The 10-year ASCVD risk score (Arnett DK, et al., 2019) is: 15.8%    Assessment & Plan:   #1 type 2 diabetes slightly improved with A1c today 7.2%.  Patient currently on glipizide  and metformin .  Apparently, insurance would not cover Jardiance .  Patient prefers to give this 3 more months of continued lifestyle management to try to get below 7.  Urine microalbumin screen ordered.  #2  hyperlipidemia treated with rosuvastatin .  Needs follow-up labs.  Our lab was closed today at the time he was here.  Future lab order for lipid and CMP and he will return Friday for that.  Continue low saturated fat diet  #3 elevated blood pressure.  Borderline elevation previously.  Patient states his home blood pressures are generally better controlled.  We discussed possible initiation of ACE or ARB but he prefers to give this a few months.  Discussed Dash eating plan with handout given.  Bring back home readings at follow-up for review. Return in about 3 months (around 03/21/2024).    Wolm Scarlet, MD     [1] No Known Allergies

## 2023-12-24 ENCOUNTER — Other Ambulatory Visit

## 2023-12-24 DIAGNOSIS — E785 Hyperlipidemia, unspecified: Secondary | ICD-10-CM

## 2023-12-24 DIAGNOSIS — E11319 Type 2 diabetes mellitus with unspecified diabetic retinopathy without macular edema: Secondary | ICD-10-CM

## 2023-12-24 LAB — COMPREHENSIVE METABOLIC PANEL WITH GFR
ALT: 26 U/L (ref 3–53)
AST: 23 U/L (ref 5–37)
Albumin: 4.6 g/dL (ref 3.5–5.2)
Alkaline Phosphatase: 72 U/L (ref 39–117)
BUN: 14 mg/dL (ref 6–23)
CO2: 30 meq/L (ref 19–32)
Calcium: 9.2 mg/dL (ref 8.4–10.5)
Chloride: 104 meq/L (ref 96–112)
Creatinine, Ser: 1.03 mg/dL (ref 0.40–1.50)
GFR: 81.83 mL/min
Glucose, Bld: 112 mg/dL — ABNORMAL HIGH (ref 70–99)
Potassium: 4.1 meq/L (ref 3.5–5.1)
Sodium: 141 meq/L (ref 135–145)
Total Bilirubin: 0.4 mg/dL (ref 0.2–1.2)
Total Protein: 7.3 g/dL (ref 6.0–8.3)

## 2023-12-24 LAB — LIPID PANEL
Cholesterol: 105 mg/dL (ref 28–200)
HDL: 53.4 mg/dL
LDL Cholesterol: 45 mg/dL (ref 10–99)
NonHDL: 52.07
Total CHOL/HDL Ratio: 2
Triglycerides: 35 mg/dL (ref 10.0–149.0)
VLDL: 7 mg/dL (ref 0.0–40.0)

## 2023-12-24 LAB — MICROALBUMIN / CREATININE URINE RATIO
Creatinine,U: 59.3 mg/dL
Microalb Creat Ratio: 46.4 mg/g — ABNORMAL HIGH (ref 0.0–30.0)
Microalb, Ur: 2.8 mg/dL — ABNORMAL HIGH (ref 0.7–1.9)

## 2023-12-26 ENCOUNTER — Ambulatory Visit: Payer: Self-pay | Admitting: Family Medicine

## 2023-12-28 MED ORDER — LISINOPRIL 10 MG PO TABS: 10.0000 mg | ORAL_TABLET | Freq: Every day | ORAL | 3 refills | Status: AC

## 2023-12-28 NOTE — Addendum Note (Signed)
 Addended by: METTA KRISTEN CROME on: 12/28/2023 01:47 PM   Modules accepted: Orders

## 2023-12-28 NOTE — Telephone Encounter (Signed)
 Patient informed of the results and voiced understanding. Rx sent

## 2024-01-20 ENCOUNTER — Other Ambulatory Visit: Payer: Self-pay | Admitting: Family Medicine
